# Patient Record
Sex: Female | Born: 1960 | Race: Black or African American | Hispanic: No | Smoking: Never smoker
Health system: Southern US, Community
[De-identification: ages and names within clinical notes are randomized; demographics above are authoritative.]

## PROBLEM LIST (undated history)

## (undated) DIAGNOSIS — R112 Nausea with vomiting, unspecified: Secondary | ICD-10-CM

## (undated) DIAGNOSIS — N390 Urinary tract infection, site not specified: Secondary | ICD-10-CM

## (undated) DIAGNOSIS — Z9889 Other specified postprocedural states: Secondary | ICD-10-CM

## (undated) DIAGNOSIS — Z9289 Personal history of other medical treatment: Secondary | ICD-10-CM

## (undated) DIAGNOSIS — I514 Myocarditis, unspecified: Secondary | ICD-10-CM

## (undated) HISTORY — PX: CHOLECYSTECTOMY: SHX55

## (undated) HISTORY — DX: Personal history of other medical treatment: Z92.89

## (undated) HISTORY — DX: Myocarditis, unspecified: I51.4

---

## 1997-07-21 ENCOUNTER — Other Ambulatory Visit: Admission: RE | Admit: 1997-07-21 | Discharge: 1997-07-21 | Payer: Self-pay | Admitting: Obstetrics & Gynecology

## 1998-05-11 ENCOUNTER — Other Ambulatory Visit: Admission: RE | Admit: 1998-05-11 | Discharge: 1998-05-11 | Payer: Self-pay | Admitting: Obstetrics & Gynecology

## 1999-05-20 ENCOUNTER — Other Ambulatory Visit: Admission: RE | Admit: 1999-05-20 | Discharge: 1999-05-20 | Payer: Self-pay | Admitting: Obstetrics and Gynecology

## 2000-08-15 ENCOUNTER — Other Ambulatory Visit: Admission: RE | Admit: 2000-08-15 | Discharge: 2000-08-15 | Payer: Self-pay | Admitting: Obstetrics and Gynecology

## 2001-09-25 ENCOUNTER — Other Ambulatory Visit: Admission: RE | Admit: 2001-09-25 | Discharge: 2001-09-25 | Payer: Self-pay | Admitting: Obstetrics and Gynecology

## 2005-04-26 ENCOUNTER — Emergency Department (HOSPITAL_COMMUNITY): Admission: EM | Admit: 2005-04-26 | Discharge: 2005-04-26 | Payer: Self-pay | Admitting: Emergency Medicine

## 2005-10-12 ENCOUNTER — Encounter: Admission: RE | Admit: 2005-10-12 | Discharge: 2005-10-12 | Payer: Self-pay | Admitting: Family Medicine

## 2005-10-20 ENCOUNTER — Ambulatory Visit (HOSPITAL_BASED_OUTPATIENT_CLINIC_OR_DEPARTMENT_OTHER): Admission: RE | Admit: 2005-10-20 | Discharge: 2005-10-20 | Payer: Self-pay | Admitting: *Deleted

## 2012-04-22 ENCOUNTER — Encounter (HOSPITAL_COMMUNITY): Payer: Self-pay

## 2012-04-23 ENCOUNTER — Other Ambulatory Visit (HOSPITAL_COMMUNITY): Payer: Self-pay | Admitting: Orthopaedic Surgery

## 2012-04-23 ENCOUNTER — Other Ambulatory Visit (HOSPITAL_COMMUNITY): Payer: Self-pay | Admitting: Orthopedic Surgery

## 2012-04-24 ENCOUNTER — Encounter (HOSPITAL_COMMUNITY)
Admission: RE | Admit: 2012-04-24 | Discharge: 2012-04-24 | Disposition: A | Discharge status: Home / Self Care | Payer: 59 | Source: Ambulatory Visit | Attending: Orthopaedic Surgery | Admitting: Orthopaedic Surgery

## 2012-04-24 ENCOUNTER — Encounter (HOSPITAL_COMMUNITY): Payer: Self-pay

## 2012-04-24 HISTORY — DX: Urinary tract infection, site not specified: N39.0

## 2012-04-24 HISTORY — DX: Nausea with vomiting, unspecified: R11.2

## 2012-04-24 HISTORY — DX: Other specified postprocedural states: Z98.890

## 2012-04-24 LAB — COMPREHENSIVE METABOLIC PANEL
ALT: 11 U/L (ref 0–35)
AST: 17 U/L (ref 0–37)
Albumin: 3.5 g/dL (ref 3.5–5.2)
Alkaline Phosphatase: 68 U/L (ref 39–117)
BUN: 9 mg/dL (ref 6–23)
CO2: 25 mEq/L (ref 19–32)
Creatinine, Ser: 0.96 mg/dL (ref 0.50–1.10)
GFR calc Af Amer: 79 mL/min — ABNORMAL LOW (ref >90)
Glucose, Bld: 95 mg/dL (ref 70–99)
Potassium: 4.1 mEq/L (ref 3.5–5.1)

## 2012-04-24 LAB — CBC
HCT: 41.6 % (ref 36.0–46.0)
Hemoglobin: 14.1 g/dL (ref 12.0–15.0)
MCH: 30.2 pg (ref 26.0–34.0)
MCHC: 33.9 g/dL (ref 30.0–36.0)

## 2012-04-24 NOTE — Pre-Procedure Instructions (Addendum)
SAHEJ HAUSWIRTH  04/24/2012   Your procedure is scheduled on: 04/29/12  Report to [facility] at 1030 AM.  Call this number if you have problems the morning of surgery: [phone]   Remember:   Do not eat food or drink liquids after midnight.   Take these medicines the morning of surgery with A SIP OF WATER: flexeril, tramadol   Do not wear jewelry, make-up or nail polish.  Do not wear lotions, powders, or perfumes. You may wear deodorant.  Do not shave 48 hours prior to surgery. Men may shave face and neck.  Do not bring valuables to the hospital.  Contacts, dentures or bridgework may not be worn into surgery.  Leave suitcase in the car. After surgery it may be brought to your room.  For patients admitted to the hospital, checkout time is 11:00 AM the day of  discharge.   Patients discharged the day of surgery will not be allowed to drive  home.  Name and phone number of your driver:   Special Instructions: Incentive Spirometry - Practice and bring it with you on the day of surgery. Shower using CHG 2 nights before surgery and the night before surgery.  If you shower the day of surgery use CHG.  Use special wash - you have one bottle of CHG for all showers.  You should use approximately 1/3 of the bottle for each shower.   Please read over the following fact sheets that you were given: Pain Booklet, Coughing and Deep Breathing, MRSA Information and Surgical Site Infection Prevention

## 2012-04-26 DIAGNOSIS — M4722 Other spondylosis with radiculopathy, cervical region: Secondary | ICD-10-CM | POA: Diagnosis present

## 2012-04-26 NOTE — H&P (Signed)
Caroline Garcia is an 52 y.o. female.   Chief Complaint: neck pain and bilateral arm pain Left>right HPI: over 2 years of worsening upper extremity pain with activity.  Left arm with pain radiating from the neck into her shoulder and to the middle fingers.  She has had temporary relief with conservative measures including TENS unit , traction and NSAIDs as well as steroid dose pack.   MRI with C5-6 and C6-7 stenosis related to broad based disc osteophyte complex with extension to the left and mild cord flattening at C5-6 and broad based disc protrusion left at C6-7. She wishes to proceed with surgical intervention.  Past Medical History  Diagnosis Date  . PONV (postoperative nausea and vomiting)   . UTI (urinary tract infection)     hx    Past Surgical History  Procedure Laterality Date  . Cholecystectomy      History reviewed. No pertinent family history. Social History:  reports that she has never smoked. She does not have any smokeless tobacco history on file. She reports that she does not drink alcohol or use illicit drugs.  Allergies: No Known Allergies  Medications Prior to Admission  Medication Sig Dispense Refill  . cyclobenzaprine (FLEXERIL) 10 MG tablet Take 10 mg by mouth at bedtime as needed for muscle spasms.      . traMADol (ULTRAM) 50 MG tablet Take 50 mg by mouth every 6 (six) hours as needed for pain.        No results found for this or any previous visit (from the past 48 hour(s)). No results found.  Review of Systems  Constitutional: Negative.   HENT: Positive for neck pain.   Eyes: Negative.   Respiratory: Negative.   Cardiovascular: Negative.   Gastrointestinal: Negative.   Genitourinary: Negative.   Skin: Negative.   Neurological: Negative.   Endo/Heme/Allergies: Negative.   Psychiatric/Behavioral: Negative.     Blood pressure 138/93, pulse 94, temperature 98.3 F (36.8 C), temperature source Oral, resp. rate 20, last menstrual period 04/12/2012,  SpO2 98.00%. Physical Exam  Constitutional: She is oriented to person, place, and time. She appears well-developed and well-nourished.  HENT:  Head: Normocephalic and atraumatic.  Eyes: EOM are normal. Pupils are equal, round, and reactive to light.  Cardiovascular: Normal rate.   Respiratory: Effort normal and breath sounds normal.  GI: Soft. Bowel sounds are normal.  Musculoskeletal:  +sprurling on the left.  Pain with ROM in cervical spine.  Strength intact in UEs.  DTRs +2 symmetric.  Neurological: She is alert and oriented to person, place, and time.  Skin: Skin is warm and dry.  Psychiatric: She has a normal mood and affect.     Assessment/Plan Cervical spondylosis C5-6 and C6-7  PLAN:  Anterior cervical discectomy and fusion C5-6 and C6-7 with allograft, plate and screws.  Aniello Christopoulos C 04/29/2012, 12:30 PM

## 2012-04-28 MED ORDER — CEFAZOLIN SODIUM-DEXTROSE 2-3 GM-% IV SOLR
2.0000 g | INTRAVENOUS | Status: AC
  Administered 2012-04-29: 2 g via INTRAVENOUS
  Filled 2012-04-28: qty 50

## 2012-04-29 ENCOUNTER — Ambulatory Visit (HOSPITAL_COMMUNITY): Payer: 59

## 2012-04-29 ENCOUNTER — Encounter (HOSPITAL_COMMUNITY): Payer: Self-pay | Admitting: Certified Registered Nurse Anesthetist

## 2012-04-29 ENCOUNTER — Encounter (HOSPITAL_COMMUNITY): Payer: Self-pay

## 2012-04-29 ENCOUNTER — Inpatient Hospital Stay (HOSPITAL_COMMUNITY)
Admission: RE | Admit: 2012-04-29 | Discharge: 2012-04-30 | DRG: 473 | Disposition: A | Discharge status: Home / Self Care | Payer: 59 | Source: Ambulatory Visit | Attending: Orthopaedic Surgery | Admitting: Orthopaedic Surgery

## 2012-04-29 ENCOUNTER — Encounter (HOSPITAL_COMMUNITY)
Admission: RE | Disposition: A | Discharge status: Home / Self Care | Payer: Self-pay | Source: Ambulatory Visit | Attending: Orthopaedic Surgery

## 2012-04-29 ENCOUNTER — Ambulatory Visit (HOSPITAL_COMMUNITY): Payer: 59 | Admitting: Certified Registered Nurse Anesthetist

## 2012-04-29 DIAGNOSIS — Z79899 Other long term (current) drug therapy: Secondary | ICD-10-CM

## 2012-04-29 DIAGNOSIS — Z9089 Acquired absence of other organs: Secondary | ICD-10-CM

## 2012-04-29 DIAGNOSIS — M4722 Other spondylosis with radiculopathy, cervical region: Secondary | ICD-10-CM | POA: Diagnosis present

## 2012-04-29 DIAGNOSIS — K219 Gastro-esophageal reflux disease without esophagitis: Secondary | ICD-10-CM | POA: Diagnosis present

## 2012-04-29 DIAGNOSIS — M47812 Spondylosis without myelopathy or radiculopathy, cervical region: Principal | ICD-10-CM | POA: Diagnosis present

## 2012-04-29 DIAGNOSIS — M5020 Other cervical disc displacement, unspecified cervical region: Secondary | ICD-10-CM | POA: Diagnosis present

## 2012-04-29 HISTORY — PX: ANTERIOR CERVICAL DECOMP/DISCECTOMY FUSION: SHX1161

## 2012-04-29 SURGERY — ANTERIOR CERVICAL DECOMPRESSION/DISCECTOMY FUSION 2 LEVELS
Anesthesia: General | Site: Spine Cervical | Wound class: Clean

## 2012-04-29 MED ORDER — SENNOSIDES-DOCUSATE SODIUM 8.6-50 MG PO TABS: 1.0000 | ORAL_TABLET | Freq: Every evening | ORAL | Status: DC | PRN

## 2012-04-29 MED ORDER — SODIUM CHLORIDE 0.9 % IJ SOLN: 3.0000 mL | Freq: Two times a day (BID) | INTRAMUSCULAR | Status: DC

## 2012-04-29 MED ORDER — KCL IN DEXTROSE-NACL 20-5-0.45 MEQ/L-%-% IV SOLN
INTRAVENOUS | Status: DC
  Administered 2012-04-29: 18:00:00 via INTRAVENOUS
  Filled 2012-04-29 (×3): qty 1000

## 2012-04-29 MED ORDER — SODIUM CHLORIDE 0.9 % IJ SOLN: 3.0000 mL | INTRAMUSCULAR | Status: DC | PRN

## 2012-04-29 MED ORDER — PANTOPRAZOLE SODIUM 40 MG IV SOLR
40.0000 mg | Freq: Every day | INTRAVENOUS | Status: DC
  Administered 2012-04-29: 40 mg via INTRAVENOUS
  Filled 2012-04-29 (×4): qty 40

## 2012-04-29 MED ORDER — LIDOCAINE HCL (CARDIAC) 20 MG/ML IV SOLN
INTRAVENOUS | Status: DC | PRN
  Administered 2012-04-29: 40 mg via INTRAVENOUS

## 2012-04-29 MED ORDER — METHOCARBAMOL 500 MG PO TABS: 500.0000 mg | ORAL_TABLET | Freq: Four times a day (QID) | ORAL | Status: DC | PRN

## 2012-04-29 MED ORDER — ACETAMINOPHEN 650 MG RE SUPP: 650.0000 mg | RECTAL | Status: DC | PRN

## 2012-04-29 MED ORDER — MIDAZOLAM HCL 5 MG/5ML IJ SOLN
INTRAMUSCULAR | Status: DC | PRN
  Administered 2012-04-29: 2 mg via INTRAVENOUS

## 2012-04-29 MED ORDER — MENTHOL 3 MG MT LOZG
1.0000 | LOZENGE | OROMUCOSAL | Status: DC | PRN
  Filled 2012-04-29: qty 9

## 2012-04-29 MED ORDER — HYDROCODONE-ACETAMINOPHEN 5-325 MG PO TABS: 1.0000 | ORAL_TABLET | ORAL | Status: DC | PRN

## 2012-04-29 MED ORDER — 0.9 % SODIUM CHLORIDE (POUR BTL) OPTIME
TOPICAL | Status: DC | PRN
  Administered 2012-04-29: 1000 mL

## 2012-04-29 MED ORDER — KETOROLAC TROMETHAMINE 30 MG/ML IJ SOLN
30.0000 mg | Freq: Once | INTRAMUSCULAR | Status: AC
  Administered 2012-04-29: 30 mg via INTRAVENOUS
  Filled 2012-04-29: qty 1

## 2012-04-29 MED ORDER — CEFAZOLIN SODIUM 1-5 GM-% IV SOLN
1.0000 g | Freq: Three times a day (TID) | INTRAVENOUS | Status: AC
  Administered 2012-04-29 – 2012-04-30 (×2): 1 g via INTRAVENOUS
  Filled 2012-04-29 (×2): qty 50

## 2012-04-29 MED ORDER — TRAMADOL HCL 50 MG PO TABS: 50.0000 mg | ORAL_TABLET | Freq: Four times a day (QID) | ORAL | Status: DC | PRN

## 2012-04-29 MED ORDER — OXYCODONE HCL 5 MG/5ML PO SOLN: 5.0000 mg | Freq: Once | ORAL | Status: DC | PRN

## 2012-04-29 MED ORDER — BUPIVACAINE-EPINEPHRINE 0.25% -1:200000 IJ SOLN
INTRAMUSCULAR | Status: DC | PRN
  Administered 2012-04-29: 7 mL

## 2012-04-29 MED ORDER — LACTATED RINGERS IV SOLN
INTRAVENOUS | Status: DC | PRN
  Administered 2012-04-29 (×2): via INTRAVENOUS

## 2012-04-29 MED ORDER — METHOCARBAMOL 100 MG/ML IJ SOLN
500.0000 mg | Freq: Four times a day (QID) | INTRAVENOUS | Status: DC | PRN
  Filled 2012-04-29: qty 5

## 2012-04-29 MED ORDER — PHENOL 1.4 % MT LIQD: 1.0000 | OROMUCOSAL | Status: DC | PRN

## 2012-04-29 MED ORDER — ONDANSETRON HCL 4 MG/2ML IJ SOLN
INTRAMUSCULAR | Status: DC | PRN
  Administered 2012-04-29: 4 mg via INTRAVENOUS

## 2012-04-29 MED ORDER — OXYCODONE-ACETAMINOPHEN 5-325 MG PO TABS: 1.0000 | ORAL_TABLET | ORAL | Status: DC | PRN

## 2012-04-29 MED ORDER — OXYCODONE HCL 5 MG PO TABS: 5.0000 mg | ORAL_TABLET | Freq: Once | ORAL | Status: DC | PRN

## 2012-04-29 MED ORDER — GLYCOPYRROLATE 0.2 MG/ML IJ SOLN
INTRAMUSCULAR | Status: DC | PRN
  Administered 2012-04-29: .6 mg via INTRAVENOUS

## 2012-04-29 MED ORDER — ONDANSETRON HCL 4 MG/2ML IJ SOLN
4.0000 mg | INTRAMUSCULAR | Status: DC | PRN
  Administered 2012-04-29 (×2): 4 mg via INTRAVENOUS
  Filled 2012-04-29 (×2): qty 2

## 2012-04-29 MED ORDER — SODIUM CHLORIDE 0.9 % IV SOLN
250.0000 mL | INTRAVENOUS | Status: DC
  Administered 2012-04-29: 250 mL via INTRAVENOUS

## 2012-04-29 MED ORDER — LACTATED RINGERS IV SOLN
INTRAVENOUS | Status: DC
  Administered 2012-04-29: 20 mL/h via INTRAVENOUS

## 2012-04-29 MED ORDER — BISACODYL 10 MG RE SUPP: 10.0000 mg | Freq: Every day | RECTAL | Status: DC | PRN

## 2012-04-29 MED ORDER — MORPHINE SULFATE 2 MG/ML IJ SOLN
1.0000 mg | INTRAMUSCULAR | Status: DC | PRN
  Administered 2012-04-29: 2 mg via INTRAVENOUS
  Filled 2012-04-29: qty 1

## 2012-04-29 MED ORDER — ALUM & MAG HYDROXIDE-SIMETH 200-200-20 MG/5ML PO SUSP: 30.0000 mL | Freq: Four times a day (QID) | ORAL | Status: DC | PRN

## 2012-04-29 MED ORDER — FENTANYL CITRATE 0.05 MG/ML IJ SOLN
INTRAMUSCULAR | Status: DC | PRN
  Administered 2012-04-29: 150 ug via INTRAVENOUS

## 2012-04-29 MED ORDER — ACETAMINOPHEN 325 MG PO TABS: 650.0000 mg | ORAL_TABLET | ORAL | Status: DC | PRN

## 2012-04-29 MED ORDER — OXYCODONE-ACETAMINOPHEN 5-325 MG PO TABS
1.0000 | ORAL_TABLET | ORAL | Status: DC | PRN
  Administered 2012-04-29 – 2012-04-30 (×3): 2 via ORAL
  Filled 2012-04-29 (×3): qty 2

## 2012-04-29 MED ORDER — ROCURONIUM BROMIDE 100 MG/10ML IV SOLN
INTRAVENOUS | Status: DC | PRN
  Administered 2012-04-29: 50 mg via INTRAVENOUS
  Administered 2012-04-29 (×2): 10 mg via INTRAVENOUS

## 2012-04-29 MED ORDER — NEOSTIGMINE METHYLSULFATE 1 MG/ML IJ SOLN
INTRAMUSCULAR | Status: DC | PRN
  Administered 2012-04-29: 4 mg via INTRAVENOUS

## 2012-04-29 MED ORDER — ZOLPIDEM TARTRATE 5 MG PO TABS: 5.0000 mg | ORAL_TABLET | Freq: Every evening | ORAL | Status: DC | PRN

## 2012-04-29 MED ORDER — HYDROMORPHONE HCL PF 1 MG/ML IJ SOLN
INTRAMUSCULAR | Status: AC
  Administered 2012-04-29: 0.5 mg via INTRAVENOUS
  Filled 2012-04-29: qty 1

## 2012-04-29 MED ORDER — PHENYLEPHRINE HCL 10 MG/ML IJ SOLN
INTRAMUSCULAR | Status: DC | PRN
  Administered 2012-04-29 (×2): 80 ug via INTRAVENOUS

## 2012-04-29 MED ORDER — HYDROMORPHONE HCL PF 1 MG/ML IJ SOLN
0.2500 mg | INTRAMUSCULAR | Status: DC | PRN
  Administered 2012-04-29: 0.5 mg via INTRAVENOUS

## 2012-04-29 MED ORDER — PROPOFOL 10 MG/ML IV BOLUS
INTRAVENOUS | Status: DC | PRN
  Administered 2012-04-29: 140 mg via INTRAVENOUS

## 2012-04-29 MED ORDER — DOCUSATE SODIUM 100 MG PO CAPS
100.0000 mg | ORAL_CAPSULE | Freq: Two times a day (BID) | ORAL | Status: DC
  Administered 2012-04-29 – 2012-04-30 (×2): 100 mg via ORAL
  Filled 2012-04-29 (×5): qty 1

## 2012-04-29 MED ORDER — FLEET ENEMA 7-19 GM/118ML RE ENEM: 1.0000 | ENEMA | Freq: Once | RECTAL | Status: AC | PRN

## 2012-04-29 SURGICAL SUPPLY — 57 items
APL SKNCLS STERI-STRIP NONHPOA (GAUZE/BANDAGES/DRESSINGS) ×1
BENZOIN TINCTURE PRP APPL 2/3 (GAUZE/BANDAGES/DRESSINGS) ×3 IMPLANT
BIT DRILL SKYLINE 12MM (BIT) ×1 IMPLANT
BLADE SURG ROTATE 9660 (MISCELLANEOUS) IMPLANT
BONE CERV LORDOTIC 14.5X12X6 (Bone Implant) ×2 IMPLANT
BONE CERV LORDOTIC 14.5X12X7 (Bone Implant) ×2 IMPLANT
BUR ROUND FLUTED 4 SOFT TCH (BURR) ×1 IMPLANT
CLOTH BEACON ORANGE TIMEOUT ST (SAFETY) ×2 IMPLANT
CLSR STERI-STRIP ANTIMIC 1/2X4 (GAUZE/BANDAGES/DRESSINGS) ×1 IMPLANT
COLLAR CERV LO CONTOUR FIRM DE (SOFTGOODS) ×2 IMPLANT
CORDS BIPOLAR (ELECTRODE) ×1 IMPLANT
COVER SURGICAL LIGHT HANDLE (MISCELLANEOUS) ×2 IMPLANT
DRAPE C-ARM 42X72 X-RAY (DRAPES) ×2 IMPLANT
DRAPE MICROSCOPE LEICA (MISCELLANEOUS) ×2 IMPLANT
DRAPE PROXIMA HALF (DRAPES) ×2 IMPLANT
DRILL BIT SKYLINE 12MM (BIT) ×2
DURAPREP 6ML APPLICATOR 50/CS (WOUND CARE) ×2 IMPLANT
ELECT COATED BLADE 2.86 ST (ELECTRODE) ×2 IMPLANT
ELECT REM PT RETURN 9FT ADLT (ELECTROSURGICAL) ×2
ELECTRODE REM PT RTRN 9FT ADLT (ELECTROSURGICAL) ×1 IMPLANT
EVACUATOR 1/8 PVC DRAIN (DRAIN) ×2 IMPLANT
GAUZE XEROFORM 1X8 LF (GAUZE/BANDAGES/DRESSINGS) ×2 IMPLANT
GLOVE BIOGEL PI IND STRL 7.5 (GLOVE) ×1 IMPLANT
GLOVE BIOGEL PI IND STRL 8 (GLOVE) ×1 IMPLANT
GLOVE BIOGEL PI INDICATOR 7.5 (GLOVE) ×1
GLOVE BIOGEL PI INDICATOR 8 (GLOVE) ×1
GLOVE ECLIPSE 7.0 STRL STRAW (GLOVE) ×2 IMPLANT
GLOVE ORTHO TXT STRL SZ7.5 (GLOVE) ×2 IMPLANT
GOWN PREVENTION PLUS LG XLONG (DISPOSABLE) ×1 IMPLANT
GOWN PREVENTION PLUS XLARGE (GOWN DISPOSABLE) ×2 IMPLANT
GOWN STRL NON-REIN LRG LVL3 (GOWN DISPOSABLE) ×4 IMPLANT
GRAFT BNE SPCR VG2 14.5X12X6 (Bone Implant) IMPLANT
GRAFT BNE SPCR VG2 14.5X12X7 (Bone Implant) IMPLANT
HEAD HALTER (SOFTGOODS) ×2 IMPLANT
HEMOSTAT SURGICEL 2X14 (HEMOSTASIS) IMPLANT
KIT BASIN OR (CUSTOM PROCEDURE TRAY) ×2 IMPLANT
KIT ROOM TURNOVER OR (KITS) ×2 IMPLANT
MANIFOLD NEPTUNE II (INSTRUMENTS) ×2 IMPLANT
NDL 25GX 5/8IN NON SAFETY (NEEDLE) ×1 IMPLANT
NEEDLE 25GX 5/8IN NON SAFETY (NEEDLE) ×2 IMPLANT
NS IRRIG 1000ML POUR BTL (IV SOLUTION) ×2 IMPLANT
PACK ORTHO CERVICAL (CUSTOM PROCEDURE TRAY) ×2 IMPLANT
PAD ARMBOARD 7.5X6 YLW CONV (MISCELLANEOUS) ×4 IMPLANT
PATTIES SURGICAL .5 X.5 (GAUZE/BANDAGES/DRESSINGS) ×1 IMPLANT
PIN TEMP SKYLINE THREADED (PIN) ×2 IMPLANT
PLATE TWO LEVEL SKYLINE 30MM (Plate) ×1 IMPLANT
SCREW VARIABLE SELF TAP 12MM (Screw) ×12 IMPLANT
SPONGE GAUZE 4X4 12PLY (GAUZE/BANDAGES/DRESSINGS) ×2 IMPLANT
STRIP CLOSURE SKIN 1/2X4 (GAUZE/BANDAGES/DRESSINGS) ×2 IMPLANT
SURGIFLO TRUKIT (HEMOSTASIS) IMPLANT
SUT VIC AB 3-0 X1 27 (SUTURE) ×2 IMPLANT
SUT VICRYL 4-0 PS2 18IN ABS (SUTURE) ×4 IMPLANT
SYR 30ML SLIP (SYRINGE) ×2 IMPLANT
TAPE CLOTH SURG 4X10 WHT LF (GAUZE/BANDAGES/DRESSINGS) ×1 IMPLANT
TOWEL OR 17X24 6PK STRL BLUE (TOWEL DISPOSABLE) ×2 IMPLANT
TOWEL OR 17X26 10 PK STRL BLUE (TOWEL DISPOSABLE) ×2 IMPLANT
WATER STERILE IRR 1000ML POUR (IV SOLUTION) ×2 IMPLANT

## 2012-04-29 NOTE — Transfer of Care (Signed)
Immediate Anesthesia Transfer of Care Note  Patient: Caroline Garcia  Procedure(s) Performed: Procedure(s) with comments: C5-6, C6-7 Anterior Cervical Discectomy and Fusion, Allograft, Plate (N/A) - Z6-1, C6-7 Anterior Cervical Discectomy and Fusion, Allograft and Plate  Patient Location: PACU  Anesthesia Type:General  Level of Consciousness: awake, alert , oriented and patient cooperative  Airway & Oxygen Therapy: Patient Spontanous Breathing and Patient connected to nasal cannula oxygen  Post-op Assessment: Report given to PACU RN, Post -op Vital signs reviewed and stable and Patient moving all extremities X 4  Post vital signs: Reviewed and stable  Complications: No apparent anesthesia complications

## 2012-04-29 NOTE — Brief Op Note (Cosign Needed)
04/29/2012  2:35 PM  PATIENT:  Caroline Garcia  52 y.o. 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  PROCEDURE:  Procedure(s) with comments: C5-6, C6-7 Anterior Cervical Discectomy and Fusion, Allograft, Plate (N/A) - Z6-1, C6-7 Anterior Cervical Discectomy and Fusion, Allograft and Plate  SURGEON:  Surgeon(s) and Role:    * Eldred Manges, MD - Primary  PHYSICIAN ASSISTANT: Maud Deed PAC  ASSISTANTS: none   ANESTHESIA:   general  EBL:  Total I/O In: 1000 [I.V.:1000] Out: -   BLOOD ADMINISTERED:none  DRAINS: (1) Hemovact drain(s) in the anterior neck with  Suction Open   LOCAL MEDICATIONS USED:  MARCAINE     SPECIMEN:  No Specimen  DISPOSITION OF SPECIMEN:  N/A  COUNTS:  YES  TOURNIQUET:  * No tourniquets in log *  DICTATION: .Note written in EPIC  PLAN OF CARE: Admit to inpatient   PATIENT DISPOSITION:  PACU - hemodynamically stable.   Delay start of Pharmacological VTE agent (>24hrs) due to surgical blood loss or risk of bleeding: yes

## 2012-04-29 NOTE — Anesthesia Procedure Notes (Addendum)
Procedure Name: Intubation Date/Time: 04/29/2012 12:46 PM Performed by: Rogelia Boga Pre-anesthesia Checklist: Patient identified, Emergency Drugs available, Suction available, Patient being monitored and Timeout performed Patient Re-evaluated:Patient Re-evaluated prior to inductionOxygen Delivery Method: Circle system utilized Preoxygenation: Pre-oxygenation with 100% oxygen Intubation Type: IV induction Ventilation: Two handed mask ventilation required and Oral airway inserted - appropriate to patient size Laryngoscope Size: Mac and 3 Grade View: Grade II Tube type: Oral Tube size: 7.5 mm Number of attempts: 1 Airway Equipment and Method: Stylet and Oral airway Placement Confirmation: ETT inserted through vocal cords under direct vision,  positive ETCO2 and breath sounds checked- equal and bilateral Secured at: 23 cm Tube secured with: Tape Dental Injury: Injury to lip

## 2012-04-29 NOTE — Anesthesia Preprocedure Evaluation (Addendum)
Anesthesia Evaluation  Patient identified by MRN, date of birth, ID band Patient awake    Reviewed: Allergy & Precautions, H&P , NPO status , Patient's Chart, lab work & pertinent test results  History of Anesthesia Complications (+) PONV  Airway Mallampati: III TM Distance: >3 FB Neck ROM: Full    Dental no notable dental hx. (+) Teeth Intact and Dental Advisory Given   Pulmonary neg pulmonary ROS,  breath sounds clear to auscultation  Pulmonary exam normal       Cardiovascular negative cardio ROS  Rhythm:Regular Rate:Normal     Neuro/Psych negative neurological ROS  negative psych ROS   GI/Hepatic Neg liver ROS, GERD-  Controlled,  Endo/Other  negative endocrine ROS  Renal/GU negative Renal ROS  negative genitourinary   Musculoskeletal   Abdominal   Peds  Hematology negative hematology ROS (+)   Anesthesia Other Findings   Reproductive/Obstetrics negative OB ROS                          Anesthesia Physical Anesthesia Plan  ASA: II  Anesthesia Plan: General   Post-op Pain Management:    Induction: Intravenous  Airway Management Planned: Oral ETT  Additional Equipment:   Intra-op Plan:   Post-operative Plan: Extubation in OR  Informed Consent: I have reviewed the patients History and Physical, chart, labs and discussed the procedure including the risks, benefits and alternatives for the proposed anesthesia with the patient or authorized representative who has indicated his/her understanding and acceptance.   Dental advisory given  Plan Discussed with: CRNA  Anesthesia Plan Comments:         Anesthesia Quick Evaluation

## 2012-04-29 NOTE — Op Note (Signed)
NAMEGREDMARIE, DELANGE             ACCOUNT NO.:  [id number]  MEDICAL RECORD NO.:  [id number]  LOCATION:  5N17C                        FACILITY:  [facility]  PHYSICIAN:  Anaisabel Pederson C. Ophelia Charter, M.D.    DATE OF BIRTH:  1960-10-04  DATE OF PROCEDURE:  04/29/2012 DATE OF DISCHARGE:                              OPERATIVE REPORT   PREOPERATIVE DIAGNOSIS:  Cervical spondylosis and HNP.  POSTOPERATIVE DIAGNOSIS:  Cervical spondylosis and HNP.  PROCEDURE:  C5-6, C6-7 anterior cervical diskectomy and fusion, allograft and plate.  SURGEON:  Tsutomu Barfoot C. Ophelia Charter, MD  ASSISTANT:  Wende Neighbors, P.A., medically necessary and present for the entire procedure.  ESTIMATED BLOOD LOSS:  Minimal.  DRAINS:  None.  ANESTHESIA:  GOT plus 7 mL of Marcaine local.  COMPONENTS IMPLANTED:  DePuy Skyline 30-mm plate with 12 mm screws x6, locking screws.  DESCRIPTION OF PROCEDURE:  After induction of general anesthesia, orotracheal intubation, and altered tractional horseshoe head holder, arms tucked to the side.  Pads over the ulnar nerve.  Wrist restraints tied for traction be applied to the body habitus.  Neck was prepped with DuraPrep.  After the neck was adjusted so that it was in the midline without angulation, right or left.  Tincture of benzoin, and some tape was applied to the anterior chest wall distally so that the breast allowed for visualization of the area of the skin on the neck.  After prepping with DuraPrep, the area was squared with towels and Betadine.  was applied.  Sterile skin marker was used on prominent skin crease midway between the 2 plane levels based on palpable landmarks including carotid tubercle in cricothyroid cartilage.  Time-out procedure was completed.  Sterile Mayo stand was at the head and thyroid sheet and drapes were applied.  Incision was made at the midline, extended in the left, platysma was divided in line with the fibers.  Blunt dissection down the level longus coli above  the omohyoid muscle and short 25 needle was placed at C5-6 with the interspace and the C-arm had to be oblique.  Traction applied to the wrist restraints in order to confirm that this was C5-6 level.  There was a small anterior spur, largest spur at C6-7 corresponding with plain x-ray. Self-retaining Cloward retractors were placed.  Teeth blades right and left smooth blades up and down, diskectomy was performed with 15 scalpel blade, pituitary Cloward curettes.  Operative microscope was draped and brought in.  Posterior aspect of the disk was taken down, bur was used as well as curetting rasp.  Posterior longitudinal ligament was exposed. Posterior longitudinal ligament was taken down and on the left side, there was fragments of disk that were teased out.  There were no pocket, on the left side lateral to the ligament.  Dura was visualized. Uncovertebral joints were stripped with Cloward curettes.  Foramina was enlarged with a 1 and 2 mm Kerrisons rasping and then trial sizing.  A 7 mm graft was inserted with good fit, countersunk 1 mm anterior aspect of the graft was marked with a skin marker in the midline.  Graft was countersunk 1-2 mm.  Identical procedure was repeated at the C6-7 level. There was disk fragments  that were teased out with a black nerve hook grasper micropituitary and removed on the left side, again consistent with the patient's symptoms.  Once the ligament was taken down, spurs were removed,  Endplates were rasped.  Trial sizers and 7 mm graft was inserted with good fit with CRNA pulling traction to open up the disk space to place the graft.  After irrigation with saline solution.  A 30- mm plate was selected, held with a small prong, checked AP and lateral under fluoroscopy and then 12 mm screws were placed x6 to secure the plating grafts.  Once it was confirmed good position on the C-arm, once again looking good in the midline and appropriate length with  screw position good above the grafts, midposition in the C5 vertebrae.  All screws were locked down with a tiny screwdriver at the adjacent screw. Irrigation with saline solution.  Hemovac drain placed in and out technique on the left in line with the skin incision.  Platysma closed with 3-0 and 4-0 Vicryl subcuticular closure.  Tincture of benzoin, Steri-Strips, Marcaine infiltration, postop dressing, and transferred to the recovery room.  Instrument count and needle count was correct.  A soft cervical collar was applied.     Madilynn Montante C. Ophelia Charter, M.D.     MCY/MEDQ  D:  04/29/2012  T:  04/29/2012  Job:  [id number]

## 2012-04-29 NOTE — Preoperative (Signed)
Beta Blockers   Reason not to administer Beta Blockers:Not Applicable 

## 2012-04-29 NOTE — Interval H&P Note (Signed)
History and Physical Interval Note:  04/29/2012 12:31 PM  Caroline Garcia  has presented today for surgery, with the diagnosis of C5-6, C6-7 Spondylosis  The various methods of treatment have been discussed with the patient and family. After consideration of risks, benefits and other options for treatment, the patient has consented to  Procedure(s) with comments: C5-6, C6-7 Anterior Cervical Discectomy and Fusion, Allograft, Plate (N/A) - F6-2, C6-7 Anterior Cervical Discectomy and Fusion, Allograft and Plate as a surgical intervention .  The patient's history has been reviewed, patient examined, no change in status, stable for surgery.  I have reviewed the patient's chart and labs.  Questions were answered to the patient's satisfaction.     YATES,MARK C

## 2012-04-30 ENCOUNTER — Encounter (HOSPITAL_COMMUNITY): Payer: Self-pay | Admitting: Orthopaedic Surgery

## 2012-04-30 MED ORDER — PANTOPRAZOLE SODIUM 40 MG PO TBEC: 40.0000 mg | DELAYED_RELEASE_TABLET | Freq: Every day | ORAL | Status: DC

## 2012-04-30 NOTE — Progress Notes (Signed)
UR COMPLETED  

## 2012-04-30 NOTE — Anesthesia Postprocedure Evaluation (Signed)
  Anesthesia Post-op Note  Patient: Caroline Garcia  Procedure(s) Performed: Procedure(s) with comments: C5-6, C6-7 Anterior Cervical Discectomy and Fusion, Allograft, Plate (N/A) - O1-3, C6-7 Anterior Cervical Discectomy and Fusion, Allograft and Plate  Patient Location: Nursing Unit, 5N17C  Anesthesia Type:General  Level of Consciousness: awake, alert  and oriented  Airway and Oxygen Therapy: Patient Spontanous Breathing  Post-op Pain: moderate  Post-op Assessment: Post-op Vital signs reviewed, Patient's Cardiovascular Status Stable, Respiratory Function Stable and No signs of Nausea or vomiting  Post-op Vital Signs: Reviewed and stable  Complications: No apparent anesthesia complications

## 2012-04-30 NOTE — Discharge Summary (Signed)
Physician Discharge Summary  Patient ID: Caroline Garcia MRN: [id number] DOB/AGE: 51/27/1962 51 y.o.  Admit date: 04/29/2012 Discharge date: 04/30/2012  Admission Diagnoses:cervical spondylosis with HNP  Discharge Diagnoses: same Principal Problem:   Cervical spondylosis with radiculopathy   Discharged Condition: good  Hospital Course: underwent C5-6 , C6-7 ACDF allograft and plate. Satisfactory post op course. Relief of left arm pain   Consults: None  Significant Diagnostic Studies: none  Treatments: surgery: above  Discharge Exam: Blood pressure 128/76, pulse 88, temperature 98.5 F (36.9 C), temperature source Oral, resp. rate 16, last menstrual period 04/12/2012, SpO2 97.00%. Incision/Wound:clean and dry. Drain removed. Normal left arm strength.   Disposition: Final discharge disposition not confirmed     Medication List    STOP taking these medications       cyclobenzaprine 10 MG tablet  Commonly known as:  FLEXERIL      TAKE these medications       methocarbamol 500 MG tablet  Commonly known as:  ROBAXIN  Take 1 tablet (500 mg total) by mouth every 6 (six) hours as needed (spasm).     oxyCODONE-acetaminophen 5-325 MG per tablet  Commonly known as:  ROXICET  Take 1-2 tablets by mouth every 4 (four) hours as needed for pain.     traMADol 50 MG tablet  Commonly known as:  ULTRAM  Take 50 mg by mouth every 6 (six) hours as needed for pain.           Follow-up Information   Follow up with Eldred Manges, MD. Schedule an appointment as soon as possible for a visit in 2 weeks.   Contact information:   [address] [place] [address] [phone]       Signed: Eldred Manges 04/30/2012, 9:30 AM

## 2012-04-30 NOTE — Progress Notes (Signed)
Completed discharge paperwork with patients. Prescriptions given. All questions answered. Dressing changed, and some dressing supplies given to patient. Educated about pain management, exercise, and no bending, twisting, or reaching.  Patient discharged with volunteers to husbands car.

## 2017-05-05 ENCOUNTER — Other Ambulatory Visit: Payer: Self-pay

## 2017-05-05 ENCOUNTER — Encounter: Payer: Self-pay | Admitting: Emergency Medicine

## 2017-05-05 ENCOUNTER — Emergency Department
Admission: EM | Admit: 2017-05-05 | Discharge: 2017-05-05 | Disposition: A | Discharge status: Home / Self Care | Payer: BLUE CROSS/BLUE SHIELD | Attending: Emergency Medicine | Admitting: Emergency Medicine

## 2017-05-05 DIAGNOSIS — R509 Fever, unspecified: Secondary | ICD-10-CM | POA: Insufficient documentation

## 2017-05-05 DIAGNOSIS — R112 Nausea with vomiting, unspecified: Secondary | ICD-10-CM | POA: Diagnosis not present

## 2017-05-05 DIAGNOSIS — R197 Diarrhea, unspecified: Secondary | ICD-10-CM | POA: Insufficient documentation

## 2017-05-05 LAB — URINALYSIS, COMPLETE (UACMP) WITH MICROSCOPIC
BILIRUBIN URINE: NEGATIVE
Glucose, UA: NEGATIVE mg/dL
Hgb urine dipstick: NEGATIVE
KETONES UR: NEGATIVE mg/dL
Nitrite: NEGATIVE
PH: 6 (ref 5.0–8.0)
PROTEIN: NEGATIVE mg/dL
Specific Gravity, Urine: 1.016 (ref 1.005–1.030)

## 2017-05-05 LAB — LIPASE, BLOOD: LIPASE: 21 U/L (ref 11–51)

## 2017-05-05 LAB — COMPREHENSIVE METABOLIC PANEL
ALT: 17 U/L (ref 14–54)
AST: 24 U/L (ref 15–41)
Albumin: 3.7 g/dL (ref 3.5–5.0)
Alkaline Phosphatase: 51 U/L (ref 38–126)
Anion gap: 8 (ref 5–15)
BUN: 8 mg/dL (ref 6–20)
CHLORIDE: 104 mmol/L (ref 101–111)
CO2: 28 mmol/L (ref 22–32)
Calcium: 8.9 mg/dL (ref 8.9–10.3)
Creatinine, Ser: 1.01 mg/dL — ABNORMAL HIGH (ref 0.44–1.00)
Glucose, Bld: 86 mg/dL (ref 65–99)
POTASSIUM: 3.8 mmol/L (ref 3.5–5.1)
Sodium: 140 mmol/L (ref 135–145)
Total Bilirubin: 0.5 mg/dL (ref 0.3–1.2)
Total Protein: 7.3 g/dL (ref 6.5–8.1)

## 2017-05-05 LAB — CBC
HEMATOCRIT: 42.3 % (ref 35.0–47.0)
Hemoglobin: 14.1 g/dL (ref 12.0–16.0)
MCH: 30.2 pg (ref 26.0–34.0)
MCHC: 33.4 g/dL (ref 32.0–36.0)
MCV: 90.4 fL (ref 80.0–100.0)
Platelets: 268 10*3/uL (ref 150–440)
RBC: 4.68 MIL/uL (ref 3.80–5.20)
RDW: 13.7 % (ref 11.5–14.5)
WBC: 4.5 10*3/uL (ref 3.6–11.0)

## 2017-05-05 MED ORDER — ONDANSETRON 4 MG PO TBDP
4.0000 mg | ORAL_TABLET | Freq: Once | ORAL | Status: AC | PRN
  Administered 2017-05-05: 4 mg via ORAL
  Filled 2017-05-05: qty 1

## 2017-05-05 MED ORDER — ONDANSETRON HCL 4 MG PO TABS: 4.0000 mg | ORAL_TABLET | Freq: Three times a day (TID) | ORAL | 0 refills | Status: DC | PRN

## 2017-05-05 MED ORDER — SODIUM CHLORIDE 0.9 % IV BOLUS (SEPSIS)
1000.0000 mL | Freq: Once | INTRAVENOUS | Status: AC
  Administered 2017-05-05: 1000 mL via INTRAVENOUS

## 2017-05-05 NOTE — ED Triage Notes (Signed)
Pt presents to ED via POV c/o fever, abd pain, emesis, and diarrhea since Tuesday. Pt reports one episode of black stools after taking pepto-bismol but no further black stools after that.

## 2017-05-05 NOTE — ED Provider Notes (Addendum)
[facility] Emergency Department Provider Note  ____________________________________________   I have reviewed the triage vital signs and the nursing notes. Where available I have reviewed prior notes and, if possible and indicated, outside hospital notes.    HISTORY  Chief Complaint Emesis and Abdominal Pain    HPI Caroline Garcia is a 57 y.o. female who presents today complaining of nausea vomiting and diarrhea, she had fever initially a few days ago, the fever is gone, she has not actually vomited today but she has had persistent watery diarrhea.  No recent travel no recent antibiotics.  Positive sick contacts in the community.  No dysuria no urinary frequency, she has occasional cramping discomfort with this which is only present when she is actually having a bowel movement.  At this time she has no pain.  She denies any focal abdominal pain at any time.  She is having multiple episodes of watery diarrhea, that is also diminishing, she had 3 today.  It was much more over the last couple days.  She states she had Pepto-Bismol and had one episode of dark stool right after but has not had any since.  She would prefer not to have a rectal exam. Has had no melena no bright red blood per rectum.  The patient states she is feeling dehydrated, she has not passed out but she is felt slightly lightheaded after having so many bouts of diarrhea that is why she is here.  She is also tired of being sick.  Past Medical History:  Diagnosis Date  . PONV (postoperative nausea and vomiting)   . UTI (urinary tract infection)    hx    Patient Active Problem List   Diagnosis Date Noted  . Cervical spondylosis with radiculopathy 04/26/2012    Class: Diagnosis of    Past Surgical History:  Procedure Laterality Date  . ANTERIOR CERVICAL DECOMP/DISCECTOMY FUSION N/A 04/29/2012   Procedure: C5-6, C6-7 Anterior Cervical Discectomy and Fusion, Allograft, Plate;  Surgeon: Eldred Manges, MD;  Location: [facility] OR;  Service: Orthopedics;  Laterality: N/A;  C5-6, C6-7 Anterior Cervical Discectomy and Fusion, Allograft and Plate  . CHOLECYSTECTOMY      Prior to Admission medications   Medication Sig Start Date End Date Taking? Authorizing Provider  methocarbamol (ROBAXIN) 500 MG tablet Take 1 tablet (500 mg total) by mouth every 6 (six) hours as needed (spasm). Patient not taking: Reported on 05/05/2017 04/29/12   Maud Deed, PA-C  oxyCODONE-acetaminophen (ROXICET) 5-325 MG per tablet Take 1-2 tablets by mouth every 4 (four) hours as needed for pain. Patient not taking: Reported on 05/05/2017 04/29/12   Maud Deed, PA-C    Allergies Patient has no known allergies.  History reviewed. No pertinent family history.  Social History Social History   Tobacco Use  . Smoking status: Never Smoker  . Smokeless tobacco: Never Used  Substance Use Topics  . Alcohol use: No  . Drug use: No    Review of Systems Constitutional: No fever/chills Eyes: No visual changes. ENT: No sore throat. No stiff neck no neck pain Cardiovascular: Denies chest pain. Respiratory: Denies shortness of breath. Gastrointestinal:   See HPI  genitourinary: Negative for dysuria. Musculoskeletal: Negative lower extremity swelling Skin: Negative for rash. Neurological: Negative for severe headaches, focal weakness or numbness.   ____________________________________________   PHYSICAL EXAM:  VITAL SIGNS: ED Triage Vitals  Enc Vitals Group     BP 05/05/17 1313 114/69     Pulse Rate 05/05/17 1313  60     Resp 05/05/17 1313 18     Temp 05/05/17 1313 98 F (36.7 C)     Temp Source 05/05/17 1313 Oral     SpO2 05/05/17 1313 100 %     Weight 05/05/17 1313 170 lb (77.1 kg)     Height 05/05/17 1313 5\' 7"  (1.702 m)     Head Circumference --      Peak Flow --      Pain Score 05/05/17 1328 6     Pain Loc --      Pain Edu? --      Excl. in GC? --     Constitutional: Alert and oriented. Well  appearing and in no acute distress. Eyes: Conjunctivae are normal Head: Atraumatic HEENT: No congestion/rhinnorhea. Mucous membranes are moist.  Oropharynx non-erythematous Neck:   Nontender with no meningismus, no masses, no stridor Cardiovascular: Normal rate, regular rhythm. Grossly normal heart sounds.  Good peripheral circulation. Respiratory: Normal respiratory effort.  No retractions. Lungs CTAB. Abdominal: Soft and nontender. No distention. No guarding no rebound Back:  There is no focal tenderness or step off.  there is no midline tenderness there are no lesions noted. there is no CVA tenderness Musculoskeletal: No lower extremity tenderness, no upper extremity tenderness. No joint effusions, no DVT signs strong distal pulses no edema Neurologic:  Normal speech and language. No gross focal neurologic deficits are appreciated.  Skin:  Skin is warm, dry and intact. No rash noted. Psychiatric: Mood and affect are normal. Speech and behavior are normal.  ____________________________________________   LABS (all labs ordered are listed, but only abnormal results are displayed)  Labs Reviewed  COMPREHENSIVE METABOLIC PANEL - Abnormal; Notable for the following components:      Result Value   Creatinine, Ser 1.01 (*)    All other components within normal limits  URINALYSIS, COMPLETE (UACMP) WITH MICROSCOPIC - Abnormal; Notable for the following components:   Color, Urine YELLOW (*)    APPearance CLOUDY (*)    Leukocytes, UA SMALL (*)    Bacteria, UA MANY (*)    Squamous Epithelial / LPF 6-30 (*)    All other components within normal limits  URINE CULTURE  LIPASE, BLOOD  CBC  POC URINE PREG, ED    Pertinent labs  results that were available during my care of the patient were reviewed by me and considered in my medical decision making (see chart for details). ____________________________________________  EKG  I personally interpreted any EKGs ordered by me or  triage  ____________________________________________  RADIOLOGY  Pertinent labs & imaging results that were available during my care of the patient were reviewed by me and considered in my medical decision making (see chart for details). If possible, patient and/or family made aware of any abnormal findings.  No results found. ____________________________________________    PROCEDURES  Procedure(s) performed: None  Procedures  Critical Care performed: None  ____________________________________________   INITIAL IMPRESSION / ASSESSMENT AND PLAN / ED COURSE  Pertinent labs & imaging results that were available during my care of the patient were reviewed by me and considered in my medical decision making (see chart for details).  Patient here with nausea vomiting diarrhea abdominal exam is quite benign.  There is bacteria in her urine but is not a clean catch, there is lots of squamous epithelial cells.  She has no symptoms of UTI do not think her UTI is causing her to have copious watery diarrhea.  We will send a urine  culture but is likely contaminant.  Blood work otherwise quite reassuring she does appear to be mildly hydrated clinically and she is feeling somewhat dehydrated and that is why she is here.  We are giving her IV fluid and we will reassess  ----------------------------------------- 5:12 PM on 05/05/2017 -----------------------------------------  Considering the patient's symptoms, medical history, and physical examination today, I have low suspicion for cholecystitis or biliary pathology, pancreatitis, perforation or bowel obstruction, hernia, intra-abdominal abscess, AAA or dissection, volvulus or intussusception, mesenteric ischemia, ischemic gut, pyelonephritis or appendicitis.  There is no evidence of ovarian or reproductive pathology either.  Patient's abdomen is benign at this time, and she feels much better after IV fluids we will ensure that she can tolerate  p.o. and ambulate prior to possible discharge  ----------------------------------------- 6:33 PM on 05/05/2017 ----------------------------------------- Patient feels much better if she can tolerate p.o. we will go she is very comfortable with this plan.  abd benign at discharge    ____________________________________________   FINAL CLINICAL IMPRESSION(S) / ED DIAGNOSES  Final diagnoses:  None      This chart was dictated using voice recognition software.  Despite best efforts to proofread,  errors can occur which can change meaning.      Jeanmarie Plant, MD 05/05/17 1712    Jeanmarie Plant, MD 05/05/17 [id number]

## 2017-05-06 LAB — URINE CULTURE: Culture: NO GROWTH

## 2017-07-25 DIAGNOSIS — S8391XA Sprain of unspecified site of right knee, initial encounter: Secondary | ICD-10-CM | POA: Diagnosis not present

## 2017-10-03 DIAGNOSIS — Z1151 Encounter for screening for human papillomavirus (HPV): Secondary | ICD-10-CM | POA: Diagnosis not present

## 2017-10-03 DIAGNOSIS — Z130 Encounter for screening for diseases of the blood and blood-forming organs and certain disorders involving the immune mechanism: Secondary | ICD-10-CM | POA: Diagnosis not present

## 2017-10-03 DIAGNOSIS — Z1389 Encounter for screening for other disorder: Secondary | ICD-10-CM | POA: Diagnosis not present

## 2017-10-03 DIAGNOSIS — Z01419 Encounter for gynecological examination (general) (routine) without abnormal findings: Secondary | ICD-10-CM | POA: Diagnosis not present

## 2017-10-03 DIAGNOSIS — Z1231 Encounter for screening mammogram for malignant neoplasm of breast: Secondary | ICD-10-CM | POA: Diagnosis not present

## 2017-10-03 DIAGNOSIS — Z124 Encounter for screening for malignant neoplasm of cervix: Secondary | ICD-10-CM | POA: Diagnosis not present

## 2017-10-04 DIAGNOSIS — Z124 Encounter for screening for malignant neoplasm of cervix: Secondary | ICD-10-CM | POA: Diagnosis not present

## 2017-10-11 DIAGNOSIS — N951 Menopausal and female climacteric states: Secondary | ICD-10-CM | POA: Diagnosis not present

## 2018-01-11 DIAGNOSIS — N951 Menopausal and female climacteric states: Secondary | ICD-10-CM | POA: Diagnosis not present

## 2018-04-07 ENCOUNTER — Emergency Department: Payer: BLUE CROSS/BLUE SHIELD

## 2018-04-07 ENCOUNTER — Other Ambulatory Visit: Payer: Self-pay

## 2018-04-07 ENCOUNTER — Encounter: Payer: Self-pay | Admitting: Emergency Medicine

## 2018-04-07 ENCOUNTER — Emergency Department
Admission: EM | Admit: 2018-04-07 | Discharge: 2018-04-07 | Disposition: A | Discharge status: Home / Self Care | Payer: BLUE CROSS/BLUE SHIELD | Attending: Emergency Medicine | Admitting: Emergency Medicine

## 2018-04-07 DIAGNOSIS — R0602 Shortness of breath: Secondary | ICD-10-CM | POA: Diagnosis not present

## 2018-04-07 DIAGNOSIS — R079 Chest pain, unspecified: Secondary | ICD-10-CM | POA: Insufficient documentation

## 2018-04-07 DIAGNOSIS — R52 Pain, unspecified: Secondary | ICD-10-CM

## 2018-04-07 DIAGNOSIS — R072 Precordial pain: Secondary | ICD-10-CM | POA: Diagnosis not present

## 2018-04-07 LAB — CBC
HEMATOCRIT: 42.9 % (ref 36.0–46.0)
Hemoglobin: 13.8 g/dL (ref 12.0–15.0)
MCH: 29.2 pg (ref 26.0–34.0)
MCHC: 32.2 g/dL (ref 30.0–36.0)
MCV: 90.7 fL (ref 80.0–100.0)
NRBC: 0 % (ref 0.0–0.2)
Platelets: 342 10*3/uL (ref 150–400)
RBC: 4.73 MIL/uL (ref 3.87–5.11)
RDW: 13.6 % (ref 11.5–15.5)
WBC: 13.1 10*3/uL — AB (ref 4.0–10.5)

## 2018-04-07 LAB — HEPATIC FUNCTION PANEL
ALK PHOS: 46 U/L (ref 38–126)
ALT: 16 U/L (ref 0–44)
AST: 20 U/L (ref 15–41)
Albumin: 3.7 g/dL (ref 3.5–5.0)
BILIRUBIN TOTAL: 0.5 mg/dL (ref 0.3–1.2)
TOTAL PROTEIN: 7 g/dL (ref 6.5–8.1)

## 2018-04-07 LAB — TROPONIN I
Troponin I: <0.03 ng/mL (ref <0.03)
Troponin I: <0.03 ng/mL (ref <0.03)

## 2018-04-07 LAB — POCT PREGNANCY, URINE: Preg Test, Ur: NEGATIVE

## 2018-04-07 LAB — BASIC METABOLIC PANEL
Anion gap: 6 (ref 5–15)
BUN: 16 mg/dL (ref 6–20)
CHLORIDE: 106 mmol/L (ref 98–111)
CO2: 27 mmol/L (ref 22–32)
Calcium: 9.6 mg/dL (ref 8.9–10.3)
Creatinine, Ser: 1.15 mg/dL — ABNORMAL HIGH (ref 0.44–1.00)
GFR calc non Af Amer: 53 mL/min — ABNORMAL LOW (ref >60)
Glucose, Bld: 92 mg/dL (ref 70–99)
POTASSIUM: 4.6 mmol/L (ref 3.5–5.1)
SODIUM: 139 mmol/L (ref 135–145)

## 2018-04-07 LAB — FIBRIN DERIVATIVES D-DIMER (ARMC ONLY): Fibrin derivatives D-dimer (ARMC): 268.19 ng/mL (FEU) (ref 0.00–499.00)

## 2018-04-07 LAB — LIPASE, BLOOD: Lipase: 25 U/L (ref 11–51)

## 2018-04-07 MED ORDER — ALUM & MAG HYDROXIDE-SIMETH 200-200-20 MG/5ML PO SUSP
30.0000 mL | Freq: Once | ORAL | Status: AC
  Administered 2018-04-07: 30 mL via ORAL
  Filled 2018-04-07: qty 30

## 2018-04-07 MED ORDER — HALOPERIDOL LACTATE 5 MG/ML IJ SOLN
5.0000 mg | Freq: Once | INTRAMUSCULAR | Status: AC
  Administered 2018-04-07: 5 mg via INTRAVENOUS
  Filled 2018-04-07: qty 1

## 2018-04-07 MED ORDER — LIDOCAINE VISCOUS HCL 2 % MT SOLN
15.0000 mL | Freq: Once | OROMUCOSAL | Status: AC
  Administered 2018-04-07: 15 mL via ORAL
  Filled 2018-04-07: qty 15

## 2018-04-07 MED ORDER — KETOROLAC TROMETHAMINE 30 MG/ML IJ SOLN
30.0000 mg | Freq: Once | INTRAMUSCULAR | Status: AC
  Administered 2018-04-07: 30 mg via INTRAVENOUS
  Filled 2018-04-07: qty 1

## 2018-04-07 MED ORDER — FENTANYL CITRATE (PF) 100 MCG/2ML IJ SOLN
50.0000 ug | Freq: Once | INTRAMUSCULAR | Status: AC
  Administered 2018-04-07: 50 ug via INTRAVENOUS
  Filled 2018-04-07: qty 2

## 2018-04-07 MED ORDER — VALACYCLOVIR HCL 1 G PO TABS: 1000.0000 mg | ORAL_TABLET | Freq: Three times a day (TID) | ORAL | 0 refills | Status: DC

## 2018-04-07 MED ORDER — SODIUM CHLORIDE 0.9% FLUSH
3.0000 mL | Freq: Once | INTRAVENOUS | Status: AC
  Administered 2018-04-07: 3 mL via INTRAVENOUS

## 2018-04-07 MED ORDER — KETOROLAC TROMETHAMINE 10 MG PO TABS: 10.0000 mg | ORAL_TABLET | Freq: Three times a day (TID) | ORAL | 0 refills | Status: DC | PRN

## 2018-04-07 MED ORDER — IOHEXOL 350 MG/ML SOLN
75.0000 mL | Freq: Once | INTRAVENOUS | Status: AC | PRN
  Administered 2018-04-07: 75 mL via INTRAVENOUS

## 2018-04-07 NOTE — ED Notes (Signed)
Pt appeared to have some relief from pain medication initially- pt's breathing became easy and pt was able to lie still Pt now sitting up in bed moaning again and has had an episode of emesis

## 2018-04-07 NOTE — ED Triage Notes (Signed)
Pt presents to ED via POV with c/o sudden onset substernal CP with radiation to L arm and L side of her neck. Pt c/o SOB with the chest pain, states it feels like a weight is on her chest and feels like she cannot breathe.

## 2018-04-07 NOTE — Discharge Instructions (Signed)
Please seek medical attention for any high fevers, chest pain, shortness of breath, change in behavior, persistent vomiting, bloody stool or any other new or concerning symptoms.  

## 2018-04-07 NOTE — ED Provider Notes (Signed)
[facility] Emergency Department Provider Note  ____________________________________________   I have reviewed the triage vital signs and the nursing notes.   HISTORY  Chief Complaint Chest Pain   History limited by: Not Limited   HPI Caroline Garcia is a 58 y.o. female who presents to the emergency department today because of concerns for chest pain.  Pain started roughly 3 hours prior to my evaluation.  Is located in the center chest.  She describes it as pressure-like.  It is worse with movement.  She also feels like the pain is worse when she tries to take a deep breath.  She denies any cough.  The patient denies any nausea or vomiting.  She denies ever having pain like this before.  She denies any trauma to her chest.  Per medical record review patient has a history of UTI  Past Medical History:  Diagnosis Date  . PONV (postoperative nausea and vomiting)   . UTI (urinary tract infection)    hx    Patient Active Problem List   Diagnosis Date Noted  . Cervical spondylosis with radiculopathy 04/26/2012    Class: Diagnosis of    Past Surgical History:  Procedure Laterality Date  . ANTERIOR CERVICAL DECOMP/DISCECTOMY FUSION N/A 04/29/2012   Procedure: C5-6, C6-7 Anterior Cervical Discectomy and Fusion, Allograft, Plate;  Surgeon: Eldred Manges, MD;  Location: [facility] OR;  Service: Orthopedics;  Laterality: N/A;  C5-6, C6-7 Anterior Cervical Discectomy and Fusion, Allograft and Plate  . CHOLECYSTECTOMY      Prior to Admission medications   Medication Sig Start Date End Date Taking? Authorizing Provider  methocarbamol (ROBAXIN) 500 MG tablet Take 1 tablet (500 mg total) by mouth every 6 (six) hours as needed (spasm). Patient not taking: Reported on 05/05/2017 04/29/12   Maud Deed, PA-C  ondansetron (ZOFRAN) 4 MG tablet Take 1 tablet (4 mg total) by mouth every 8 (eight) hours as needed for nausea or vomiting. 05/05/17   Jeanmarie Plant, MD   oxyCODONE-acetaminophen (ROXICET) 5-325 MG per tablet Take 1-2 tablets by mouth every 4 (four) hours as needed for pain. Patient not taking: Reported on 05/05/2017 04/29/12   Maud Deed, PA-C    Allergies Patient has no known allergies.  No family history on file.  Social History Social History   Tobacco Use  . Smoking status: Never Smoker  . Smokeless tobacco: Never Used  Substance Use Topics  . Alcohol use: No  . Drug use: No    Review of Systems Constitutional: No fever/chills Eyes: No visual changes. ENT: No sore throat. Cardiovascular: Positive for chest pain. Respiratory: Positive for shortness of breath. Gastrointestinal: No abdominal pain.  No nausea, no vomiting.  No diarrhea.   Genitourinary: Negative for dysuria. Musculoskeletal: Negative for back pain. Skin: Negative for rash. Neurological: Negative for headaches, focal weakness or numbness.  ____________________________________________   PHYSICAL EXAM:  VITAL SIGNS: ED Triage Vitals  Enc Vitals Group     BP 04/07/18 1453 (!) 152/87     Pulse Rate 04/07/18 1453 78     Resp 04/07/18 1453 18     Temp 04/07/18 1453 98 F (36.7 C)     Temp Source 04/07/18 1453 Oral     SpO2 04/07/18 1453 100 %     Weight 04/07/18 1454 180 lb (81.6 kg)     Height 04/07/18 1454 5\' 7"  (1.702 m)     Head Circumference --      Peak Flow --  Pain Score 04/07/18 1454 10    Constitutional: Alert and oriented.  Eyes: Conjunctivae are normal.  ENT      Head: Normocephalic and atraumatic.      Nose: No congestion/rhinnorhea.      Mouth/Throat: Mucous membranes are moist.      Neck: No stridor. Hematological/Lymphatic/Immunilogical: No cervical lymphadenopathy. Cardiovascular: Normal rate, regular rhythm.  No murmurs, rubs, or gallops.  Respiratory: Normal respiratory effort without tachypnea nor retractions. Breath sounds are clear and equal bilaterally. No wheezes/rales/rhonchi. Gastrointestinal: Soft and tender to  palpation in the upper abdomen.  Genitourinary: Deferred Musculoskeletal: Normal range of motion in all extremities. No lower extremity edema. Neurologic:  Normal speech and language. No gross focal neurologic deficits are appreciated.  Skin:  Skin is warm, dry and intact. No rash noted. Psychiatric: Mood and affect are normal. Speech and behavior are normal. Patient exhibits appropriate insight and judgment.  ____________________________________________    LABS (pertinent positives/negatives)  Trop <0.03 x 2 CBC wbc 13.1, hgb 13.8, plt 342 BMP wnl except cr 1.15 D-dimer 268.19 Lipase 25 LFTs wnl ____________________________________________   EKG  I, Phineas Semen, attending physician, personally viewed and interpreted this EKG  EKG Time: 1447 Rate: 92 Rhythm: normal sinus rhythm Axis: normal Intervals: qtc 427 QRS: narrow ST changes: no st elevation Impression: normal ekg   ____________________________________________    RADIOLOGY  CXR No acute findings  CT angio No acute findings  ____________________________________________   PROCEDURES  Procedures  ____________________________________________   INITIAL IMPRESSION / ASSESSMENT AND PLAN / ED COURSE  Pertinent labs & imaging results that were available during my care of the patient were reviewed by me and considered in my medical decision making (see chart for details).   Patient presented to the emergency department today because of concerns for severe onset of the left sided chest pain with some radiation to her abdomen.  On exam patient appeared uncomfortable and did have tenderness, and abdomen.  Differential would be broad.  Work-up had troponin negative x2.  Chest x-ray was negative.  Given tenting pain a d-dimer was sent which was negative.  Patient underwent a CT angios to evaluate for dissection.  This was also negative.  Patient did eventually get some relief with Toradol.  At this point  unclear etiology of the patient's pain however broad work-up was negative.  I do think it is safe for patient to go home.  Given that she got some relief with Toradol will give patient prescription for Toradol.  Also give patient prescription for Valtrex in case this is more shingles type pain.  Did discuss with patient that she does not need to fill it unless she notices a rash erupt.  Discussed with patient portance of primary care follow-up.   ____________________________________________   FINAL CLINICAL IMPRESSION(S) / ED DIAGNOSES  Final diagnoses:  Nonspecific chest pain  Pain     Note: This dictation was prepared with Dragon dictation. Any transcriptional errors that result from this process are unintentional    Phineas Semen, MD 04/07/18 2044

## 2018-04-09 ENCOUNTER — Inpatient Hospital Stay
Admission: EM | Admit: 2018-04-09 | Discharge: 2018-04-11 | DRG: 287 | Disposition: A | Discharge status: Home / Self Care | Payer: BLUE CROSS/BLUE SHIELD | Attending: Internal Medicine | Admitting: Internal Medicine

## 2018-04-09 ENCOUNTER — Other Ambulatory Visit: Payer: Self-pay

## 2018-04-09 ENCOUNTER — Encounter
Admission: EM | Disposition: A | Discharge status: Home / Self Care | Payer: Self-pay | Source: Home / Self Care | Attending: Internal Medicine

## 2018-04-09 ENCOUNTER — Emergency Department: Payer: BLUE CROSS/BLUE SHIELD

## 2018-04-09 ENCOUNTER — Encounter: Payer: Self-pay | Admitting: Emergency Medicine

## 2018-04-09 DIAGNOSIS — I259 Chronic ischemic heart disease, unspecified: Secondary | ICD-10-CM | POA: Diagnosis not present

## 2018-04-09 DIAGNOSIS — R079 Chest pain, unspecified: Secondary | ICD-10-CM | POA: Diagnosis not present

## 2018-04-09 DIAGNOSIS — I319 Disease of pericardium, unspecified: Secondary | ICD-10-CM | POA: Diagnosis present

## 2018-04-09 DIAGNOSIS — I219 Acute myocardial infarction, unspecified: Secondary | ICD-10-CM | POA: Diagnosis not present

## 2018-04-09 DIAGNOSIS — Z79899 Other long term (current) drug therapy: Secondary | ICD-10-CM | POA: Diagnosis not present

## 2018-04-09 DIAGNOSIS — Z87440 Personal history of urinary (tract) infections: Secondary | ICD-10-CM

## 2018-04-09 DIAGNOSIS — I361 Nonrheumatic tricuspid (valve) insufficiency: Secondary | ICD-10-CM | POA: Diagnosis not present

## 2018-04-09 DIAGNOSIS — D72829 Elevated white blood cell count, unspecified: Secondary | ICD-10-CM | POA: Diagnosis not present

## 2018-04-09 DIAGNOSIS — R7989 Other specified abnormal findings of blood chemistry: Secondary | ICD-10-CM | POA: Diagnosis not present

## 2018-04-09 DIAGNOSIS — I214 Non-ST elevation (NSTEMI) myocardial infarction: Secondary | ICD-10-CM | POA: Diagnosis not present

## 2018-04-09 DIAGNOSIS — Z981 Arthrodesis status: Secondary | ICD-10-CM

## 2018-04-09 DIAGNOSIS — I340 Nonrheumatic mitral (valve) insufficiency: Secondary | ICD-10-CM | POA: Diagnosis not present

## 2018-04-09 DIAGNOSIS — Z9049 Acquired absence of other specified parts of digestive tract: Secondary | ICD-10-CM | POA: Diagnosis not present

## 2018-04-09 DIAGNOSIS — I514 Myocarditis, unspecified: Secondary | ICD-10-CM | POA: Diagnosis not present

## 2018-04-09 HISTORY — PX: LEFT HEART CATH AND CORONARY ANGIOGRAPHY: CATH118249

## 2018-04-09 LAB — BASIC METABOLIC PANEL
Anion gap: 11 (ref 5–15)
BUN: 16 mg/dL (ref 6–20)
CALCIUM: 9.1 mg/dL (ref 8.9–10.3)
CO2: 20 mmol/L — AB (ref 22–32)
Chloride: 106 mmol/L (ref 98–111)
Creatinine, Ser: 0.94 mg/dL (ref 0.44–1.00)
GFR calc non Af Amer: >60 mL/min (ref >60)
Glucose, Bld: 93 mg/dL (ref 70–99)
Potassium: 3.5 mmol/L (ref 3.5–5.1)
Sodium: 137 mmol/L (ref 135–145)

## 2018-04-09 LAB — CBC
HCT: 40.2 % (ref 36.0–46.0)
Hemoglobin: 12.8 g/dL (ref 12.0–15.0)
MCH: 29.5 pg (ref 26.0–34.0)
MCHC: 31.8 g/dL (ref 30.0–36.0)
MCV: 92.6 fL (ref 80.0–100.0)
Platelets: 319 10*3/uL (ref 150–400)
RBC: 4.34 MIL/uL (ref 3.87–5.11)
RDW: 13.8 % (ref 11.5–15.5)
WBC: 15 10*3/uL — ABNORMAL HIGH (ref 4.0–10.5)
nRBC: 0 % (ref 0.0–0.2)

## 2018-04-09 LAB — HEMOGLOBIN A1C
Hgb A1c MFr Bld: 5.5 % (ref 4.8–5.6)
Mean Plasma Glucose: 111.15 mg/dL

## 2018-04-09 LAB — SEDIMENTATION RATE: Sed Rate: 50 mm/hr — ABNORMAL HIGH (ref 0–30)

## 2018-04-09 LAB — C-REACTIVE PROTEIN: CRP: 9 mg/dL — ABNORMAL HIGH (ref <1.0)

## 2018-04-09 LAB — TROPONIN I
Troponin I: 7.85 ng/mL (ref <0.03)
Troponin I: 15.66 ng/mL (ref <0.03)

## 2018-04-09 LAB — GLUCOSE, CAPILLARY: Glucose-Capillary: 117 mg/dL — ABNORMAL HIGH (ref 70–99)

## 2018-04-09 LAB — MRSA PCR SCREENING: MRSA BY PCR: NEGATIVE

## 2018-04-09 SURGERY — LEFT HEART CATH AND CORONARY ANGIOGRAPHY
Anesthesia: Moderate Sedation

## 2018-04-09 MED ORDER — ASPIRIN 81 MG PO CHEW
CHEWABLE_TABLET | ORAL | Status: AC
  Filled 2018-04-09: qty 4

## 2018-04-09 MED ORDER — ASPIRIN 81 MG PO CHEW
CHEWABLE_TABLET | ORAL | Status: DC | PRN
  Administered 2018-04-09: 324 mg via ORAL

## 2018-04-09 MED ORDER — NITROGLYCERIN 0.4 MG SL SUBL
0.4000 mg | SUBLINGUAL_TABLET | SUBLINGUAL | Status: DC | PRN
  Administered 2018-04-09: 0.4 mg via SUBLINGUAL
  Filled 2018-04-09: qty 1

## 2018-04-09 MED ORDER — SODIUM CHLORIDE 0.9% FLUSH
3.0000 mL | Freq: Two times a day (BID) | INTRAVENOUS | Status: DC
  Administered 2018-04-10 (×3): 3 mL via INTRAVENOUS

## 2018-04-09 MED ORDER — ASPIRIN 81 MG PO CHEW
81.0000 mg | CHEWABLE_TABLET | Freq: Every day | ORAL | Status: DC
  Administered 2018-04-10 – 2018-04-11 (×2): 81 mg via ORAL
  Filled 2018-04-09 (×2): qty 1

## 2018-04-09 MED ORDER — ACETAMINOPHEN 325 MG PO TABS
650.0000 mg | ORAL_TABLET | ORAL | Status: DC | PRN
  Administered 2018-04-10 – 2018-04-11 (×4): 650 mg via ORAL
  Filled 2018-04-09 (×4): qty 2

## 2018-04-09 MED ORDER — FENTANYL CITRATE (PF) 100 MCG/2ML IJ SOLN
INTRAMUSCULAR | Status: DC | PRN
  Administered 2018-04-09 (×4): 25 ug via INTRAVENOUS

## 2018-04-09 MED ORDER — SODIUM CHLORIDE 0.9% FLUSH: 3.0000 mL | INTRAVENOUS | Status: DC | PRN

## 2018-04-09 MED ORDER — FENTANYL CITRATE (PF) 100 MCG/2ML IJ SOLN
INTRAMUSCULAR | Status: AC
  Filled 2018-04-09: qty 2

## 2018-04-09 MED ORDER — NITROGLYCERIN 2 % TD OINT
0.5000 [in_us] | TOPICAL_OINTMENT | Freq: Four times a day (QID) | TRANSDERMAL | Status: DC
  Administered 2018-04-09 – 2018-04-11 (×7): 0.5 [in_us] via TOPICAL
  Filled 2018-04-09 (×6): qty 1

## 2018-04-09 MED ORDER — SODIUM CHLORIDE 0.9 % IV SOLN
INTRAVENOUS | Status: DC
  Administered 2018-04-09: 18:00:00 via INTRAVENOUS

## 2018-04-09 MED ORDER — SODIUM CHLORIDE 0.9 % IV SOLN: 250.0000 mL | INTRAVENOUS | Status: DC | PRN

## 2018-04-09 MED ORDER — ONDANSETRON HCL 4 MG/2ML IJ SOLN: 4.0000 mg | Freq: Four times a day (QID) | INTRAMUSCULAR | Status: DC | PRN

## 2018-04-09 MED ORDER — DEXTROSE 50 % IV SOLN
12.5000 g | Freq: Once | INTRAVENOUS | Status: AC
  Administered 2018-04-09: 12.5 g via INTRAVENOUS

## 2018-04-09 MED ORDER — ENOXAPARIN SODIUM 40 MG/0.4ML ~~LOC~~ SOLN
40.0000 mg | SUBCUTANEOUS | Status: DC
  Administered 2018-04-10 – 2018-04-11 (×2): 40 mg via SUBCUTANEOUS
  Filled 2018-04-09 (×2): qty 0.4

## 2018-04-09 MED ORDER — HEPARIN SODIUM (PORCINE) 1000 UNIT/ML IJ SOLN
INTRAMUSCULAR | Status: DC | PRN
  Administered 2018-04-09: 4000 [IU] via INTRAVENOUS

## 2018-04-09 MED ORDER — NITROGLYCERIN 5 MG/ML IV SOLN
INTRAVENOUS | Status: AC
  Filled 2018-04-09: qty 10

## 2018-04-09 MED ORDER — MIDAZOLAM HCL 2 MG/2ML IJ SOLN
INTRAMUSCULAR | Status: AC
  Filled 2018-04-09: qty 2

## 2018-04-09 MED ORDER — HEPARIN (PORCINE) IN NACL 1000-0.9 UT/500ML-% IV SOLN
INTRAVENOUS | Status: AC
  Filled 2018-04-09: qty 1000

## 2018-04-09 MED ORDER — HEPARIN SODIUM (PORCINE) 1000 UNIT/ML IJ SOLN
INTRAMUSCULAR | Status: AC
  Filled 2018-04-09: qty 1

## 2018-04-09 MED ORDER — VERAPAMIL HCL 2.5 MG/ML IV SOLN
INTRAVENOUS | Status: DC | PRN
  Administered 2018-04-09: 2.5 mg via INTRA_ARTERIAL

## 2018-04-09 MED ORDER — VERAPAMIL HCL 2.5 MG/ML IV SOLN
INTRAVENOUS | Status: AC
  Filled 2018-04-09: qty 2

## 2018-04-09 MED ORDER — SODIUM CHLORIDE 0.9% FLUSH: 3.0000 mL | Freq: Once | INTRAVENOUS | Status: DC

## 2018-04-09 MED ORDER — HEPARIN (PORCINE) IN NACL 1000-0.9 UT/500ML-% IV SOLN
INTRAVENOUS | Status: DC | PRN
  Administered 2018-04-09: 500 mL

## 2018-04-09 MED ORDER — COLCHICINE 0.6 MG PO TABS
0.6000 mg | ORAL_TABLET | Freq: Every day | ORAL | Status: DC
  Administered 2018-04-09 – 2018-04-11 (×3): 0.6 mg via ORAL
  Filled 2018-04-09 (×3): qty 1

## 2018-04-09 MED ORDER — ATORVASTATIN CALCIUM 20 MG PO TABS
40.0000 mg | ORAL_TABLET | Freq: Every day | ORAL | Status: DC
  Administered 2018-04-09 – 2018-04-10 (×2): 40 mg via ORAL
  Filled 2018-04-09: qty 2

## 2018-04-09 MED ORDER — MIDAZOLAM HCL 2 MG/2ML IJ SOLN
INTRAMUSCULAR | Status: DC | PRN
  Administered 2018-04-09: 1 mg via INTRAVENOUS
  Administered 2018-04-09 (×2): 0.5 mg via INTRAVENOUS

## 2018-04-09 SURGICAL SUPPLY — 17 items
CANNULA 5F STIFF (CANNULA) ×2 IMPLANT
CATH INFINITI 5 FR JL3.5 (CATHETERS) ×2 IMPLANT
CATH INFINITI 5FR ANG PIGTAIL (CATHETERS) ×3 IMPLANT
CATH INFINITI JR4 5F (CATHETERS) ×2 IMPLANT
CATH LAUNCHER 6FR EBU 3 (CATHETERS) ×2 IMPLANT
DEVICE CLOSURE MYNXGRIP 5F (Vascular Products) ×2 IMPLANT
DEVICE INFLAT 30 PLUS (MISCELLANEOUS) ×2 IMPLANT
DEVICE RAD TR BAND REGULAR (VASCULAR PRODUCTS) ×2 IMPLANT
GLIDESHEATH SLEND SS 6F .021 (SHEATH) ×2 IMPLANT
KIT MANI 3VAL PERCEP (MISCELLANEOUS) ×3 IMPLANT
NDL PERC 18GX7CM (NEEDLE) IMPLANT
NEEDLE PERC 18GX7CM (NEEDLE) ×3 IMPLANT
PACK CARDIAC CATH (CUSTOM PROCEDURE TRAY) ×3 IMPLANT
SHEATH AVANTI 5FR X 11CM (SHEATH) ×2 IMPLANT
WIRE GUIDERIGHT .035X150 (WIRE) ×2 IMPLANT
WIRE HITORQ VERSACORE ST 145CM (WIRE) ×2 IMPLANT
WIRE ROSEN-J .035X260CM (WIRE) ×2 IMPLANT

## 2018-04-09 NOTE — ED Provider Notes (Signed)
[facility] Emergency Department Provider Note ____________________________________________   First MD Initiated Contact with Patient 04/09/18 1638     (approximate)  I have reviewed the triage vital signs and the nursing notes.   HISTORY  Chief Complaint Chest Pain    HPI Caroline Garcia is a 58 y.o. female with PMH as noted below who presents with chest pain over the last several days.  The patient was seen in the ED 2 days ago with negative work-up at that time.  She states that the pain returned today and became more severe.  Radiates to left arm.  It is not associated with shortness of breath.  She has had no prior episodes of this pain.  She has no prior cardiac history.  Past Medical History:  Diagnosis Date  . PONV (postoperative nausea and vomiting)   . UTI (urinary tract infection)    hx    Patient Active Problem List   Diagnosis Date Noted  . Cervical spondylosis with radiculopathy 04/26/2012    Class: Diagnosis of    Past Surgical History:  Procedure Laterality Date  . ANTERIOR CERVICAL DECOMP/DISCECTOMY FUSION N/A 04/29/2012   Procedure: C5-6, C6-7 Anterior Cervical Discectomy and Fusion, Allograft, Plate;  Surgeon: Eldred Manges, MD;  Location: [facility] OR;  Service: Orthopedics;  Laterality: N/A;  C5-6, C6-7 Anterior Cervical Discectomy and Fusion, Allograft and Plate  . CHOLECYSTECTOMY      Prior to Admission medications   Medication Sig Start Date End Date Taking? Authorizing Provider  ketorolac (TORADOL) 10 MG tablet Take 1 tablet (10 mg total) by mouth every 8 (eight) hours as needed for severe pain. 04/07/18   Phineas Semen, MD  methocarbamol (ROBAXIN) 500 MG tablet Take 1 tablet (500 mg total) by mouth every 6 (six) hours as needed (spasm). Patient not taking: Reported on 05/05/2017 04/29/12   Maud Deed, PA-C  ondansetron (ZOFRAN) 4 MG tablet Take 1 tablet (4 mg total) by mouth every 8 (eight) hours as needed for nausea or  vomiting. 05/05/17   Jeanmarie Plant, MD  oxyCODONE-acetaminophen (ROXICET) 5-325 MG per tablet Take 1-2 tablets by mouth every 4 (four) hours as needed for pain. Patient not taking: Reported on 05/05/2017 04/29/12   Maud Deed, PA-C  valACYclovir (VALTREX) 1000 MG tablet Take 1 tablet (1,000 mg total) by mouth 3 (three) times daily for 7 days. 04/07/18 04/14/18  Phineas Semen, MD    Allergies Patient has no known allergies.  No family history on file.  Social History Social History   Tobacco Use  . Smoking status: Never Smoker  . Smokeless tobacco: Never Used  Substance Use Topics  . Alcohol use: No  . Drug use: No    Review of Systems  Constitutional: No fever. Eyes: No redness. ENT: No neck pain. Cardiovascular: Positive for chest pain. Respiratory: Denies shortness of breath. Gastrointestinal: No vomiting or diarrhea.  Genitourinary: Negative for flank pain.  Musculoskeletal: Negative for back pain. Skin: Negative for rash. Neurological: Negative for headache.   ____________________________________________   PHYSICAL EXAM:  VITAL SIGNS: ED Triage Vitals  Enc Vitals Group     BP 04/09/18 1528 138/78     Pulse Rate 04/09/18 1528 61     Resp 04/09/18 1528 16     Temp 04/09/18 1528 98 F (36.7 C)     Temp Source 04/09/18 1528 Oral     SpO2 04/09/18 1528 100 %     Weight --      Height --  Head Circumference --      Peak Flow --      Pain Score 04/09/18 1527 10     Pain Loc --      Pain Edu? --      Excl. in GC? --     Constitutional: Alert and oriented. Well appearing and in no acute distress. Eyes: Conjunctivae are normal.  Head: Atraumatic. Nose: No congestion/rhinnorhea. Mouth/Throat: Mucous membranes are moist.   Neck: Normal range of motion.  Cardiovascular: Normal rate, regular rhythm. Grossly normal heart sounds.  Good peripheral circulation. Respiratory: Normal respiratory effort.  No retractions. Lungs CTAB. Gastrointestinal: No  distention.  Musculoskeletal: Extremities warm and well perfused.  Neurologic:  Normal speech and language. No gross focal neurologic deficits are appreciated.  Skin:  Skin is warm and dry. No rash noted. Psychiatric: Mood and affect are normal. Speech and behavior are normal.  ____________________________________________   LABS (all labs ordered are listed, but only abnormal results are displayed)  Labs Reviewed  BASIC METABOLIC PANEL - Abnormal; Notable for the following components:      Result Value   CO2 20 (*)    All other components within normal limits  CBC - Abnormal; Notable for the following components:   WBC 15.0 (*)    All other components within normal limits  TROPONIN I - Abnormal; Notable for the following components:   Troponin I 7.85 (*)    All other components within normal limits   ____________________________________________  EKG  ED ECG REPORT I, Dionne Bucy, the attending physician, personally viewed and interpreted this ECG.  Date: 04/09/2018 EKG Time: 1528 Rate: 62 Rhythm: normal sinus rhythm QRS Axis: normal Intervals: normal ST/T Wave abnormalities: ST elevation approximately 1 mm in I, II, aVL Narrative Interpretation: Borderline ST elevation concerning for acute ischemia  ____________________________________________  RADIOLOGY    ____________________________________________   PROCEDURES  Procedure(s) performed: No  Procedures  Critical Care performed: Yes  CRITICAL CARE Performed by: Dionne Bucy   Total critical care time: 30 minutes  Critical care time was exclusive of separately billable procedures and treating other patients.  Critical care was necessary to treat or prevent imminent or life-threatening deterioration.  Critical care was time spent personally by me on the following activities: development of treatment plan with patient and/or surrogate as well as nursing, discussions with consultants,  evaluation of patient's response to treatment, examination of patient, obtaining history from patient or surrogate, ordering and performing treatments and interventions, ordering and review of laboratory studies, ordering and review of radiographic studies, pulse oximetry and re-evaluation of patient's condition. ____________________________________________   INITIAL IMPRESSION / ASSESSMENT AND PLAN / ED COURSE  Pertinent labs & imaging results that were available during my care of the patient were reviewed by me and considered in my medical decision making (see chart for details).  58 year old female with PMH as noted above presents with recurrent chest pain today after being evaluated in the ED several days ago with atypical chest pain and negative work-up.  Initial EKG from triage was somewhat nonspecific although there were possible new ST changes when compared to EKG from 2 days ago, not meeting STEMI criteria at that time.  I reviewed the past medical records in Epic.  The patient has no prior cardiac history.  She was seen in the ED for chest pain on 04/07/2018.  Lab work-up and CT angiogram were negative.  Them today the patient is uncomfortable but relatively well-appearing with normal vital signs.  The remainder of  the exam is as described above.  At some point after the patient had been triagedI was told that the patient was a possible ST elevation MI and was being brought back to the room.  At that time I was given the EKG.  After reviewing it and speaking to the patient I immediately activated the STEMI team and consulted with Dr. Okey DupreEnd who immediately came to evaluate the patient and took her to the cath lab. ____________________________________________   FINAL CLINICAL IMPRESSION(S) / ED DIAGNOSES  Final diagnoses:  Myocardial infarction, unspecified MI type, unspecified artery (HCC)      NEW MEDICATIONS STARTED DURING THIS VISIT:  Current Discharge Medication List        Note:  This document was prepared using Dragon voice recognition software and may include unintentional dictation errors.    Dionne BucySiadecki, Aleiah Mohammed, MD 04/09/18 [phone]

## 2018-04-09 NOTE — Progress Notes (Signed)
[facility]  Date: 04/09/18 Time: 8:51 PM  I was contacted by the patient's nurse regarding recurrent chest pain.  Patient developed mild CP around 8 PM, which resolved with SL NTG x 1.  EKG shows widespread ST elevation as well as subtle PR depression.  Based on presenting symptoms, cath findings, and EKG appearance, I am most suspicious for myopericarditis.  Colchicine has been ordered.  I will also place NTG paste, in case there is an element of coronary vasospasm.  Continue close monitoring in the CCU.  Yvonne Kendall, MD [facility] Pager: [phone]

## 2018-04-09 NOTE — Progress Notes (Signed)
Code STEMI. Chaplain provided spiritual care support to young daughter til husband and son arrived. Offered supportive presence during procedure to offer communication with medication team. Aided family in getting to waiting area and to ICU room. Prayer offered with Cletta prior to procedure.     04/09/18 1900  Clinical Encounter Type  Visited With Patient and family together  Visit Type Code  Referral From Nurse  Spiritual Encounters  Spiritual Needs Emotional

## 2018-04-09 NOTE — ED Notes (Signed)
Dr. Okey Dupre, Cardiologist at bedside explaining procedure. Pt giving verbal consent to procedure. Pt verbalized understanding of risk and benefits at this time. Emergent procedure. This RN at bedside as witness. EDP Siadecki at bedside and aware of procedure.

## 2018-04-09 NOTE — Consult Note (Signed)
Cardiology Consultation:   Patient ID: Caroline Garcia MRN: [id number]; DOB: September 11, 1960  Admit date: 04/09/2018 Date of Consult: 04/09/2018  Primary Care Provider: Patient, No Pcp Per Primary Cardiologist: New - Dwayna Kentner Primary Electrophysiologist:  None    Patient Profile:   Caroline Garcia is a 58 y.o. female with no significant past medical history who is being seen today for the evaluation of chest pain at the request of [employer].  History of Present Illness:   Caroline Garcia reports developing intermittent chest and left arm pain ~3 days ago.  She was seen in the ED on 04/07/18 and was diagnosed with shingles.  She also tried OTC antacids without improvement.  CTA of the C/A/P was negative at that time.  She has continued to have intermittent pain, which became more severe today (10/10 substernal chest pressure).  Other than taking a deep breath, which worsens the pain, there are no obvious exacerbating or alleviating factors.  She presented to the ED and was found to have lateral ST segment elevation and a troponin of 7.9.  However, both chest pain and EKG changes improved spontaneously.  However, given presentation and elevated troponin, the patient underwent emergent LHC, which did not show any significant CAD.  LVEF was also normal.  Caroline Garcia denies a history of cardiac disease or prior testing.  She has not felt short of breath, though she notes that trying to take a deep breath at times has been painful.  She denies palpitations and lightheadedness, as well as orthopnea, PND, and edema.  Past Medical History:  Diagnosis Date  . PONV (postoperative nausea and vomiting)   . UTI (urinary tract infection)    hx    Past Surgical History:  Procedure Laterality Date  . ANTERIOR CERVICAL DECOMP/DISCECTOMY FUSION N/A 04/29/2012   Procedure: C5-6, C6-7 Anterior Cervical Discectomy and Fusion, Allograft, Plate;  Surgeon: Marybelle Killings, MD;  Location: [facility];  Service: Orthopedics;   Laterality: N/A;  C5-6, C6-7 Anterior Cervical Discectomy and Fusion, Allograft and Plate  . CHOLECYSTECTOMY       Home Medications:  Prior to Admission medications   Medication Sig Start Date Axavier Pressley Date Taking? Authorizing Provider  ketorolac (TORADOL) 10 MG tablet Take 1 tablet (10 mg total) by mouth every 8 (eight) hours as needed for severe pain. 04/07/18   Nance Pear, MD  methocarbamol (ROBAXIN) 500 MG tablet Take 1 tablet (500 mg total) by mouth every 6 (six) hours as needed (spasm). Patient not taking: Reported on 05/05/2017 04/29/12   Phillips Hay, PA-C  ondansetron (ZOFRAN) 4 MG tablet Take 1 tablet (4 mg total) by mouth every 8 (eight) hours as needed for nausea or vomiting. 05/05/17   Schuyler Amor, MD  oxyCODONE-acetaminophen (ROXICET) 5-325 MG per tablet Take 1-2 tablets by mouth every 4 (four) hours as needed for pain. Patient not taking: Reported on 05/05/2017 04/29/12   Phillips Hay, PA-C  valACYclovir (VALTREX) 1000 MG tablet Take 1 tablet (1,000 mg total) by mouth 3 (three) times daily for 7 days. 04/07/18 04/14/18  Nance Pear, MD    Inpatient Medications: Scheduled Meds: . aspirin  81 mg Oral Daily  . [START ON 04/10/2018] atorvastatin  40 mg Oral q1800  . [START ON 04/10/2018] enoxaparin (LOVENOX) injection  40 mg Subcutaneous Q24H  . nitroGLYCERIN  0.5 inch Topical Q6H  . sodium chloride flush  3 mL Intravenous Q12H   Continuous Infusions: . sodium chloride 75 mL/hr at 04/09/18 1827  . sodium chloride  PRN Meds: sodium chloride, acetaminophen, nitroGLYCERIN, ondansetron (ZOFRAN) IV, sodium chloride flush  Allergies:   No Known Allergies  Social History:   Social History   Tobacco Use  . Smoking status: Never Smoker  . Smokeless tobacco: Never Used  Substance Use Topics  . Alcohol use: No  . Drug use: No     Family History:   Patient's father and grandmother both had heart disease (details unknown).  ROS:  Patient notes nausea last week and was  concerned that she may have contracted a viral illness that was going around her place of work.  All other ROS reviewed and negative except as noted in the HPI.Marland Kitchen     Physical Exam/Data:   Vitals:   04/09/18 1641 04/09/18 1644 04/09/18 1822 04/09/18 2000  BP: (!) 128/96 131/84 115/75 127/74  Pulse: 73 81 (!) 51 (!) 55  Resp: (!) _0 Temp:   97.7 F (36.5 C)   TempSrc:   Oral   SpO2: 97% 97% 100% 100%  Weight:   85 kg   Height:   _1  (1.702 m)    No intake or output data in the 24 hours ending 04/09/18 2026 Last 3 Weights 04/09/2018 04/07/2018 05/05/2017  Weight (lbs) 187 lb 6.3 oz 180 lb 170 lb  Weight (kg) 85 kg 81.647 kg 77.111 kg     Body mass index is 29.35 kg/m.  General:  Well nourished, well developed, in no acute distress. HEENT: normal Lymph: no adenopathy Neck: no JVD Endocrine:  No thryomegaly Vascular: No carotid bruits; FA pulses 2+ bilaterally without bruits  Cardiac:  normal S1, S2; RRR; no murmur, rub, or gallop. Lungs:  clear to auscultation bilaterally, no wheezing, rhonchi or rales  Abd: soft, nontender, no hepatomegaly  Ext: no edema Musculoskeletal:  No deformities, BUE and BLE strength normal and equal Skin: warm and dry  Neuro:  CNs 2-12 intact, no focal abnormalities noted Psych:  Normal affect   EKG:  The EKG was personally reviewed and demonstrates:  NSR with non-specific ST segment changes. Telemetry:  Telemetry was personally reviewed and demonstrates:  NSR and sinus bradycardia.  Relevant CV Studies: LHC (04/09/2018): 1. No angiographically significant coronary artery disease to explain patient's chest pain, EKG changes, and elevated troponin.  Question vasospasm versus myopericarditis. 2. Normal left ventricular systolic function and filling pressure.  Laboratory Data:  Chemistry Recent Labs  Lab 04/07/18 1455 04/09/18 1534  NA 139 137  K 4.6 3.5  CL 106 106  CO2 27 20*  GLUCOSE 92 93  BUN 16 16  CREATININE 1.15* 0.94    CALCIUM 9.6 9.1  GFRNONAA 53* >60  GFRAA >60 >60  ANIONGAP 6 11    Recent Labs  Lab 04/07/18 1604  PROT 7.0  ALBUMIN 3.7  AST 20  ALT 16  ALKPHOS 46  BILITOT 0.5   Hematology Recent Labs  Lab 04/07/18 1455 04/09/18 1534  WBC 13.1* 15.0*  RBC 4.73 4.34  HGB 13.8 12.8  HCT 42.9 40.2  MCV 90.7 92.6  MCH 29.2 29.5  MCHC 32.2 31.8  RDW 13.6 13.8  PLT 342 319   Cardiac Enzymes Recent Labs  Lab 04/07/18 1455 04/07/18 1939 04/09/18 1534 04/09/18 1838  TROPONINI <0.03 <0.03 7.85* 15.66*   No results for input(s): TROPIPOC in the last 168 hours.  BNPNo results for input(s): BNP, PROBNP in the last 168 hours.  DDimer No results for input(s): DDIMER in the last 168 hours.  Radiology/Studies:  Dg Chest 2 View  Result Date: 04/09/2018 CLINICAL DATA:  Left chest pain EXAM: CHEST - 2 VIEW COMPARISON:  April 07, 2018 FINDINGS: The heart size and mediastinal contours are within normal limits. There is mild atelectasis of left lung base. No focal infiltrate, pulmonary edema, or pleural effusion. The visualized skeletal structures are stable. IMPRESSION: Mild linear opacity of left lung base, favor atelectasis. Electronically Signed   By: Abelardo Diesel M.D.   On: 04/09/2018 16:04   Dg Chest 2 View  Result Date: 04/07/2018 CLINICAL DATA:  Sudden onset of substernal chest pain radiating to the left arm and neck. Shortness of breath. EXAM: CHEST - 2 VIEW COMPARISON:  04/24/2012 FINDINGS: Interval lower cervical plate and screw fixator placement. Thoracic spondylosis. The lungs appear clear.  Cardiac and mediastinal contours normal. No pleural effusion identified. IMPRESSION: 1.  No active cardiopulmonary disease is radiographically apparent. 2. Lower cervical plate and screw fixator. 3. Thoracic spondylosis. Electronically Signed   By: Van Clines M.D.   On: 04/07/2018 15:53   Ct Angio Chest/abd/pel For Dissection W And/or Wo Contrast  Result Date: 04/07/2018 CLINICAL DATA:   Substernal chest pain. Pain in left side of neck and in left arm. EXAM: CT ANGIOGRAPHY CHEST, ABDOMEN AND PELVIS TECHNIQUE: Multidetector CT imaging through the chest, abdomen and pelvis was performed using the standard protocol during bolus administration of intravenous contrast. Multiplanar reconstructed images and MIPs were obtained and reviewed to evaluate the vascular anatomy. CONTRAST:  62m OMNIPAQUE IOHEXOL 350 MG/ML SOLN COMPARISON:  None. FINDINGS: CTA CHEST FINDINGS Cardiovascular: Heart is normal size. Aorta is normal caliber. No dissection. No filling defects in the pulmonary arteries to suggest pulmonary emboli. Mediastinum/Nodes: No mediastinal, hilar, or axillary adenopathy. Lungs/Pleura: No confluent opacities or effusions. Musculoskeletal: Chest wall soft tissues are unremarkable. No acute bony abnormality. Review of the MIP images confirms the above findings. CTA ABDOMEN AND PELVIS FINDINGS VASCULAR Aorta: Aorta is normal caliber.  No dissection. Celiac: Widely patent SMA: Widely patent Renals: Single bilaterally, widely patent IMA: Widely patent Inflow: No aneurysm or dissection. Veins: Grossly patent.  Retroaortic left renal vein noted. Review of the MIP images confirms the above findings. NON-VASCULAR Hepatobiliary: No focal liver abnormality is seen. Status post cholecystectomy. No biliary dilatation. Pancreas: No focal abnormality or ductal dilatation. Spleen: No focal abnormality.  Normal size. Adrenals/Urinary Tract: No adrenal abnormality. No focal renal abnormality. No stones or hydronephrosis. Urinary bladder is unremarkable. Stomach/Bowel: Normal appendix. Stomach, large and small bowel grossly unremarkable. Lymphatic: No adenopathy Reproductive: Probable left ovarian cyst measuring 3.7 cm. Uterus grossly unremarkable. No right adnexal mass. Other: No free fluid or free air. Musculoskeletal: No acute bony abnormality. Review of the MIP images confirms the above findings. IMPRESSION: No  evidence of aortic aneurysm or dissection. No acute cardiopulmonary disease. No acute findings in the abdomen or pelvis. 3.7 cm left ovarian cyst. Electronically Signed   By: KRolm BaptiseM.D.   On: 04/07/2018 19:16    Assessment and Plan:  Chest pain and elevated troponin Symptoms, dynamic EKG changes, and elevated troponin are certainly concerning for ACS, though catheterization did not show a culprit lesion.  Occluded small vessel could be the cause, though I would have expected to see a wall motion abnormality on the left ventriculogram.  Other potential causes include myopericarditis.  Monitor in stepdown.  ASA 81 mg daily; defer adding P2Y12 pending echocardiogram in case there is evidence of pericardial effusion.  PRN NTG for chest pain.  Start colchicine 0.6  mg daily.  Obtain transthoracic echocardiogram.  Trend troponin until it has peaked, then stop.  Check CRP and ESR.  Start atorvastatin 40 mg daily.  Check lipid panel and hemoglobin A1c for risk stratification.  Obtain urine drug screen.  For questions or updates, please contact [facility] Please consult [email or web address] for contact info under [facility] Cardiology.  Signed, Nelva Bush, MD  04/09/2018 8:26 PM

## 2018-04-09 NOTE — ED Notes (Signed)
Spoke with MD McShane about pt presentation, see orders

## 2018-04-09 NOTE — ED Triage Notes (Signed)
PT from [facility] with c/o worsening CP and LFT arm pain and numbness that started on Sunday, pt was seen w/ same symptoms and dx with possible shingles. PT denies any rash. VSS

## 2018-04-09 NOTE — ED Notes (Signed)
Charge nurse notified of elevated troponin

## 2018-04-09 NOTE — ED Notes (Signed)
Dr End cardiologist at bedside. 

## 2018-04-09 NOTE — Progress Notes (Signed)
Hypoglycemic Event  CBG: 68  Treatment: 12.5g D50  Symptoms: No obvious symptoms, pt drowsy but just arrived to unit from cath lab  Follow-up CBG: Time:1846 CBG Result:117  Possible Reasons for Event: Has been NPO   Comments/MD notified: Will recheck CBG in 15 minutes    Mukhtar Shams

## 2018-04-09 NOTE — H&P (Signed)
[facility] [place] at [facility]   PATIENT NAME: Caroline Garcia    MR#:  [id number]  DATE OF BIRTH:  12-30-1960  DATE OF ADMISSION:  04/09/2018  PRIMARY CARE PHYSICIAN: Patient, No Pcp Per   REQUESTING/REFERRING PHYSICIAN: Dr End  CHIEF COMPLAINT:  chest pain on and off several days  HISTORY OF PRESENT ILLNESS:  Caroline Garcia  is a 58 y.o. female with a known history of past medical history is listed below comes to the emergency room with chest pain on and off for several days. She was seen in the emergency room couple days ago and workup was negative at that time. Patient came back again since patient pain return and was radiating to her left arm. Denies any nausea vomiting or shortness of breath. She has no cardiac history.  Patient works as a Merchandiser, retail at [employer]. Currently no family in ICU troponin was 7.85. Cardiology was called Stat. Patient was taken to the Cath Lab. Her cardiac cath shows clean coronary arteries. She is been admitted in the ICU for further workup .  PAST MEDICAL HISTORY:   Past Medical History:  Diagnosis Date  . PONV (postoperative nausea and vomiting)   . UTI (urinary tract infection)    hx    PAST SURGICAL HISTOIRY:   Past Surgical History:  Procedure Laterality Date  . ANTERIOR CERVICAL DECOMP/DISCECTOMY FUSION N/A 04/29/2012   Procedure: C5-6, C6-7 Anterior Cervical Discectomy and Fusion, Allograft, Plate;  Surgeon: Eldred Manges, MD;  Location: [facility] OR;  Service: Orthopedics;  Laterality: N/A;  C5-6, C6-7 Anterior Cervical Discectomy and Fusion, Allograft and Plate  . CHOLECYSTECTOMY      SOCIAL HISTORY:   Social History   Tobacco Use  . Smoking status: Never Smoker  . Smokeless tobacco: Never Used  Substance Use Topics  . Alcohol use: No    FAMILY HISTORY:  No family history on file.  DRUG ALLERGIES:  No Known Allergies  REVIEW OF SYSTEMS:  Review of Systems  Constitutional: Negative for chills, fever  and weight loss.  HENT: Negative for ear discharge, ear pain and nosebleeds.   Eyes: Negative for blurred vision, pain and discharge.  Respiratory: Negative for sputum production, shortness of breath, wheezing and stridor.   Cardiovascular: Positive for chest pain. Negative for palpitations, orthopnea and PND.  Gastrointestinal: Negative for abdominal pain, diarrhea, nausea and vomiting.  Genitourinary: Negative for frequency and urgency.  Musculoskeletal: Negative for back pain and joint pain.  Neurological: Negative for sensory change, speech change, focal weakness and weakness.  Psychiatric/Behavioral: Negative for depression and hallucinations. The patient is not nervous/anxious.      MEDICATIONS AT HOME:   Prior to Admission medications   Medication Sig Start Date End Date Taking? Authorizing Provider  ketorolac (TORADOL) 10 MG tablet Take 1 tablet (10 mg total) by mouth every 8 (eight) hours as needed for severe pain. 04/07/18   Phineas Semen, MD  methocarbamol (ROBAXIN) 500 MG tablet Take 1 tablet (500 mg total) by mouth every 6 (six) hours as needed (spasm). Patient not taking: Reported on 05/05/2017 04/29/12   Maud Deed, PA-C  ondansetron (ZOFRAN) 4 MG tablet Take 1 tablet (4 mg total) by mouth every 8 (eight) hours as needed for nausea or vomiting. 05/05/17   Jeanmarie Plant, MD  oxyCODONE-acetaminophen (ROXICET) 5-325 MG per tablet Take 1-2 tablets by mouth every 4 (four) hours as needed for pain. Patient not taking: Reported on 05/05/2017 04/29/12   Maud Deed, PA-C  valACYclovir (VALTREX) 1000 MG tablet Take 1 tablet (1,000 mg total) by mouth 3 (three) times daily for 7 days. 04/07/18 04/14/18  Phineas Semen, MD      VITAL SIGNS:  Blood pressure 115/75, pulse (!) 51, temperature 97.7 F (36.5 C), temperature source Oral, resp. rate 14, height 5\' 7"  (1.702 m), weight 85 kg, SpO2 100 %.  PHYSICAL EXAMINATION:  GENERAL:  58 y.o.-year-old patient lying in the bed with no  acute distress.  EYES: Pupils equal, round, reactive to light and accommodation. No scleral icterus. Extraocular muscles intact.  HEENT: Head atraumatic, normocephalic. Oropharynx and nasopharynx clear.  NECK:  Supple, no jugular venous distention. No thyroid enlargement, no tenderness.  LUNGS: Normal breath sounds bilaterally, no wheezing, rales,rhonchi or crepitation. No use of accessory muscles of respiration.  CARDIOVASCULAR: S1, S2 normal. No murmurs, rubs, or gallops.  ABDOMEN: Soft, nontender, nondistended. Bowel sounds present. No organomegaly or mass.  EXTREMITIES: No pedal edema, cyanosis, or clubbing.  NEUROLOGIC: Cranial nerves II through XII are intact. Muscle strength 5/5 in all extremities. Sensation intact. Gait not checked.  PSYCHIATRIC: The patient is alert and oriented x 3.  SKIN: No obvious rash, lesion, or ulcer.   LABORATORY PANEL:   CBC Recent Labs  Lab 04/09/18 1534  WBC 15.0*  HGB 12.8  HCT 40.2  PLT 319   ------------------------------------------------------------------------------------------------------------------  Chemistries  Recent Labs  Lab 04/07/18 1604 04/09/18 1534  NA  --  137  K  --  3.5  CL  --  106  CO2  --  20*  GLUCOSE  --  93  BUN  --  16  CREATININE  --  0.94  CALCIUM  --  9.1  AST 20  --   ALT 16  --   ALKPHOS 46  --   BILITOT 0.5  --    ------------------------------------------------------------------------------------------------------------------  Cardiac Enzymes Recent Labs  Lab 04/09/18 1534  TROPONINI 7.85*   ------------------------------------------------------------------------------------------------------------------  RADIOLOGY:  Dg Chest 2 View  Result Date: 04/09/2018 CLINICAL DATA:  Left chest pain EXAM: CHEST - 2 VIEW COMPARISON:  April 07, 2018 FINDINGS: The heart size and mediastinal contours are within normal limits. There is mild atelectasis of left lung base. No focal infiltrate, pulmonary  edema, or pleural effusion. The visualized skeletal structures are stable. IMPRESSION: Mild linear opacity of left lung base, favor atelectasis. Electronically Signed   By: Sherian Rein M.D.   On: 04/09/2018 16:04   Ct Angio Chest/abd/pel For Dissection W And/or Wo Contrast  Result Date: 04/07/2018 CLINICAL DATA:  Substernal chest pain. Pain in left side of neck and in left arm. EXAM: CT ANGIOGRAPHY CHEST, ABDOMEN AND PELVIS TECHNIQUE: Multidetector CT imaging through the chest, abdomen and pelvis was performed using the standard protocol during bolus administration of intravenous contrast. Multiplanar reconstructed images and MIPs were obtained and reviewed to evaluate the vascular anatomy. CONTRAST:  18mL OMNIPAQUE IOHEXOL 350 MG/ML SOLN COMPARISON:  None. FINDINGS: CTA CHEST FINDINGS Cardiovascular: Heart is normal size. Aorta is normal caliber. No dissection. No filling defects in the pulmonary arteries to suggest pulmonary emboli. Mediastinum/Nodes: No mediastinal, hilar, or axillary adenopathy. Lungs/Pleura: No confluent opacities or effusions. Musculoskeletal: Chest wall soft tissues are unremarkable. No acute bony abnormality. Review of the MIP images confirms the above findings. CTA ABDOMEN AND PELVIS FINDINGS VASCULAR Aorta: Aorta is normal caliber.  No dissection. Celiac: Widely patent SMA: Widely patent Renals: Single bilaterally, widely patent IMA: Widely patent Inflow: No aneurysm or dissection. Veins: Grossly patent.  Retroaortic  left renal vein noted. Review of the MIP images confirms the above findings. NON-VASCULAR Hepatobiliary: No focal liver abnormality is seen. Status post cholecystectomy. No biliary dilatation. Pancreas: No focal abnormality or ductal dilatation. Spleen: No focal abnormality.  Normal size. Adrenals/Urinary Tract: No adrenal abnormality. No focal renal abnormality. No stones or hydronephrosis. Urinary bladder is unremarkable. Stomach/Bowel: Normal appendix. Stomach, large  and small bowel grossly unremarkable. Lymphatic: No adenopathy Reproductive: Probable left ovarian cyst measuring 3.7 cm. Uterus grossly unremarkable. No right adnexal mass. Other: No free fluid or free air. Musculoskeletal: No acute bony abnormality. Review of the MIP images confirms the above findings. IMPRESSION: No evidence of aortic aneurysm or dissection. No acute cardiopulmonary disease. No acute findings in the abdomen or pelvis. 3.7 cm left ovarian cyst. Electronically Signed   By: Charlett Nose M.D.   On: 04/07/2018 19:16    EKG:    IMPRESSION AND PLAN:   Caroline Garcia  is a 58 y.o. female with a known history of past medical history is listed below comes to the emergency room with chest pain on and off for several days. She was seen in the emergency room couple days ago and workup was negative at that time. Patient came back again since patient pain return and was radiating to her left arm. Denies any nausea vomiting or shortness of breath. She has no cardiac history. Patient's initial EKG showed some nonspecific changes.  1. Acute non-STEMI -patient came in with recurrent chest pain and elevated troponin of 7.85 -she underwent cardiac cath and showed clean coronaries -per cardiology hold off on dual antiplatelet agents.  -Echo of the heart and CRP pending -chest pain free at present  2. Leukocytosis could be reactive  3.DVT Prophylaxis subcu Lovenox  D/w Dr End All the records are reviewed and case discussed with ED provider.   CODE STATUS: full  TOTAL critical TIME TAKING CARE OF THIS PATIENT: 50 minutes.    Enedina Finner M.D on 04/09/2018 at 6:47 PM  Between 7am to 6pm - Pager - [phone]  After 6pm go to [email or web address] - password [email or web address] [facility]  [facility]  Office  [phone]  CC: Primary care physician; Patient, No Pcp Per

## 2018-04-09 NOTE — ED Notes (Signed)
Pt at this time stating that chest pressure is subsiding at this time. Pt respirations even and non-labored. Cardiologist still at bedside.

## 2018-04-09 NOTE — ED Notes (Signed)
Pt placed on the zoll monitor. MD Siadecki at bedside.

## 2018-04-10 ENCOUNTER — Encounter: Payer: Self-pay | Admitting: Internal Medicine

## 2018-04-10 ENCOUNTER — Other Ambulatory Visit: Payer: Self-pay

## 2018-04-10 ENCOUNTER — Inpatient Hospital Stay (HOSPITAL_COMMUNITY)
Admit: 2018-04-10 | Discharge: 2018-04-10 | Disposition: A | Discharge status: Home / Self Care | Payer: BLUE CROSS/BLUE SHIELD | Attending: Internal Medicine | Admitting: Internal Medicine

## 2018-04-10 DIAGNOSIS — I340 Nonrheumatic mitral (valve) insufficiency: Secondary | ICD-10-CM

## 2018-04-10 DIAGNOSIS — I361 Nonrheumatic tricuspid (valve) insufficiency: Secondary | ICD-10-CM

## 2018-04-10 DIAGNOSIS — I319 Disease of pericardium, unspecified: Principal | ICD-10-CM

## 2018-04-10 DIAGNOSIS — I259 Chronic ischemic heart disease, unspecified: Secondary | ICD-10-CM

## 2018-04-10 LAB — BASIC METABOLIC PANEL
Anion gap: 5 (ref 5–15)
BUN: 15 mg/dL (ref 6–20)
CO2: 22 mmol/L (ref 22–32)
Calcium: 8.2 mg/dL — ABNORMAL LOW (ref 8.9–10.3)
Chloride: 110 mmol/L (ref 98–111)
Creatinine, Ser: 0.88 mg/dL (ref 0.44–1.00)
GFR calc Af Amer: >60 mL/min (ref >60)
GFR calc non Af Amer: >60 mL/min (ref >60)
Glucose, Bld: 95 mg/dL (ref 70–99)
POTASSIUM: 3.7 mmol/L (ref 3.5–5.1)
Sodium: 137 mmol/L (ref 135–145)

## 2018-04-10 LAB — URINE DRUG SCREEN, QUALITATIVE (ARMC ONLY)
AMPHETAMINES, UR SCREEN: NOT DETECTED
Barbiturates, Ur Screen: NOT DETECTED
Benzodiazepine, Ur Scrn: POSITIVE — AB
Cannabinoid 50 Ng, Ur ~~LOC~~: NOT DETECTED
Cocaine Metabolite,Ur ~~LOC~~: NOT DETECTED
MDMA (Ecstasy)Ur Screen: NOT DETECTED
Methadone Scn, Ur: NOT DETECTED
Opiate, Ur Screen: NOT DETECTED
Phencyclidine (PCP) Ur S: NOT DETECTED
Tricyclic, Ur Screen: NOT DETECTED

## 2018-04-10 LAB — CBC
HCT: 33.9 % — ABNORMAL LOW (ref 36.0–46.0)
HEMOGLOBIN: 11.2 g/dL — AB (ref 12.0–15.0)
MCH: 29.3 pg (ref 26.0–34.0)
MCHC: 33 g/dL (ref 30.0–36.0)
MCV: 88.7 fL (ref 80.0–100.0)
Platelets: 282 10*3/uL (ref 150–400)
RBC: 3.82 MIL/uL — ABNORMAL LOW (ref 3.87–5.11)
RDW: 13.8 % (ref 11.5–15.5)
WBC: 12 10*3/uL — AB (ref 4.0–10.5)
nRBC: 0 % (ref 0.0–0.2)

## 2018-04-10 LAB — TROPONIN I
Troponin I: 11.87 ng/mL (ref <0.03)
Troponin I: 9.37 ng/mL (ref <0.03)

## 2018-04-10 LAB — GLUCOSE, CAPILLARY: Glucose-Capillary: 68 mg/dL — ABNORMAL LOW (ref 70–99)

## 2018-04-10 LAB — LIPID PANEL
Cholesterol: 145 mg/dL (ref 0–200)
HDL: 50 mg/dL (ref >40)
LDL Cholesterol: 78 mg/dL (ref 0–99)
TRIGLYCERIDES: 84 mg/dL (ref <150)
Total CHOL/HDL Ratio: 2.9 RATIO
VLDL: 17 mg/dL (ref 0–40)

## 2018-04-10 NOTE — Progress Notes (Signed)
Progress Note  Patient Name: Caroline Garcia Date of Encounter: 04/10/2018  Primary Cardiologist: [facility]  Subjective   Reports that she feels well, did not sleep well last night No significant chest pain today Reviewed recent data with her, cardiac catheterization findings with no significant coronary disease normal LV function Echocardiogram is pending Elevated sedimentation rate and CRP  Inpatient Medications    Scheduled Meds: . aspirin  81 mg Oral Daily  . atorvastatin  40 mg Oral q1800  . colchicine  0.6 mg Oral Daily  . enoxaparin (LOVENOX) injection  40 mg Subcutaneous Q24H  . nitroGLYCERIN  0.5 inch Topical Q6H  . sodium chloride flush  3 mL Intravenous Q12H   Continuous Infusions: . sodium chloride     PRN Meds: sodium chloride, acetaminophen, nitroGLYCERIN, ondansetron (ZOFRAN) IV, sodium chloride flush   Vital Signs    Vitals:   04/10/18 0400 04/10/18 0500 04/10/18 0700 04/10/18 0800  BP: 119/85 105/66 92/63   Pulse: 66 69 (!) 58 77  Resp: 18 20 12 20   Temp: 98.5 F (36.9 C)   98.4 F (36.9 C)  TempSrc: Oral   Oral  SpO2: 99% 96% 97% 99%  Weight:      Height:        Intake/Output Summary (Last 24 hours) at 04/10/2018 1117 Last data filed at 04/10/2018 0950 Gross per 24 hour  Intake 532.85 ml  Output 200 ml  Net 332.85 ml   Last 3 Weights 04/09/2018 04/07/2018 05/05/2017  Weight (lbs) 187 lb 6.3 oz 180 lb 170 lb  Weight (kg) 85 kg 81.647 kg 77.111 kg      Telemetry    Normal sinus rhythm- Personally Reviewed  ECG    - Personally Reviewed  Physical Exam   Constitutional:  oriented to person, place, and time. No distress.  HENT:  Head: Grossly normal Eyes:  no discharge. No scleral icterus.  Neck: No JVD, no carotid bruits  Cardiovascular: Regular rate and rhythm, no murmurs appreciated Pulmonary/Chest: Clear to auscultation bilaterally, no wheezes or rails Abdominal: Soft.  no distension.  no tenderness.  Musculoskeletal: Normal  range of motion Neurological:  normal muscle tone. Coordination normal. No atrophy Skin: Skin warm and dry Psychiatric: normal affect, pleasant   Labs    Chemistry Recent Labs  Lab 04/07/18 1455 04/07/18 1604 04/09/18 1534 04/10/18 0622  NA 139  --  137 137  K 4.6  --  3.5 3.7  CL 106  --  106 110  CO2 27  --  20* 22  GLUCOSE 92  --  93 95  BUN 16  --  16 15  CREATININE 1.15*  --  0.94 0.88  CALCIUM 9.6  --  9.1 8.2*  PROT  --  7.0  --   --   ALBUMIN  --  3.7  --   --   AST  --  20  --   --   ALT  --  16  --   --   ALKPHOS  --  46  --   --   BILITOT  --  0.5  --   --   GFRNONAA 53*  --  >60 >60  GFRAA >60  --  >60 >60  ANIONGAP 6  --  11 5     Hematology Recent Labs  Lab 04/07/18 1455 04/09/18 1534 04/10/18 0622  WBC 13.1* 15.0* 12.0*  RBC 4.73 4.34 3.82*  HGB 13.8 12.8 11.2*  HCT 42.9 40.2 33.9*  MCV 90.7 92.6 88.7  MCH 29.2 29.5 29.3  MCHC 32.2 31.8 33.0  RDW 13.6 13.8 13.8  PLT 342 319 282    Cardiac Enzymes Recent Labs  Lab 04/09/18 1534 04/09/18 1838 04/10/18 0002 04/10/18 0622  TROPONINI 7.85* 15.66* 11.87* 9.37*   No results for input(s): TROPIPOC in the last 168 hours.   BNPNo results for input(s): BNP, PROBNP in the last 168 hours.   DDimer No results for input(s): DDIMER in the last 168 hours.   Radiology    Dg Chest 2 View  Result Date: 04/09/2018 CLINICAL DATA:  Left chest pain EXAM: CHEST - 2 VIEW COMPARISON:  April 07, 2018 FINDINGS: The heart size and mediastinal contours are within normal limits. There is mild atelectasis of left lung base. No focal infiltrate, pulmonary edema, or pleural effusion. The visualized skeletal structures are stable. IMPRESSION: Mild linear opacity of left lung base, favor atelectasis. Electronically Signed   By: Sherian Rein M.D.   On: 04/09/2018 16:04    Cardiac Studies   Echocardiogram pending  Cardiac catheterization performed febrile 12/2018 1. No angiographically significant coronary  artery disease to explain patient's chest pain, EKG changes, and elevated troponin.  Question vasospasm versus myopericarditis. 2. Normal left ventricular systolic function and filling pressure.   Patient Profile     Caroline Garcia is a 58 y.o. female with no significant past medical history who is being seen today for the evaluation of chest pain Cardiac catheterization with no significant disease normal LV gram Elevated sedimentation rate and CRP  Assessment & Plan    Elevated troponin/myopericarditis Peak troponin XI now trending downward 9 Elevated sedimentation rate and CRP concerning for inflammation She denies any recent stressors apart from some GI issues 5 days ago No recent viral symptoms Initial LV gram normal ejection fraction Waiting for echocardiogram to evaluate pericardium, exclude effusion and wall motion abnormality --Would continue colchicine  No room on her blood pressure to add beta-blockers or ACE inhibitor -We will need nitroglycerin at the time of discharge for any further chest pain, unable to definitively exclude spasm.  Suggested to her if she takes nitro sublingual on a regular basis for recurrent chest pain we could try long-acting nitrates. -Suspect she may need to be monitored 1 more day given the significant climb in her troponin   Total encounter time more than 25 minutes  Greater than 50% was spent in counseling and coordination of care with the patient   For questions or updates, please contact [facility] Please consult [email or web address] for contact info under        Signed, Julien Nordmann, MD  04/10/2018, 11:17 AM

## 2018-04-10 NOTE — Progress Notes (Signed)
Report given to Serenity RN for patient to be transferred to room 247. Patient husband is at bedside during transfer.

## 2018-04-10 NOTE — Progress Notes (Signed)
[facility] [place] at [facility]   PATIENT NAME: Caroline Garcia    MR#:  [id number]  DATE OF BIRTH:  Sep 30, 1960  SUBJECTIVE:  CHIEF COMPLAINT:   Chief Complaint  Patient presents with  . Chest Pain   -Status post left heart cath yesterday with no occlusions noted.  Episode of chest pain last evening, none now.  Breathing comfortably  REVIEW OF SYSTEMS:  Review of Systems  Constitutional: Negative for chills, fever and malaise/fatigue.  HENT: Negative for congestion, hearing loss, nosebleeds and tinnitus.   Eyes: Negative for blurred vision and double vision.  Respiratory: Negative for cough, shortness of breath and wheezing.   Cardiovascular: Positive for chest pain. Negative for palpitations.  Gastrointestinal: Negative for abdominal pain, constipation, diarrhea, nausea and vomiting.  Genitourinary: Negative for dysuria.  Musculoskeletal: Negative for myalgias.  Neurological: Negative for dizziness, focal weakness, seizures, weakness and headaches.    DRUG ALLERGIES:  No Known Allergies  VITALS:  Blood pressure 92/63, pulse 77, temperature 98.4 F (36.9 C), temperature source Oral, resp. rate 20, height 5\' 7"  (1.702 m), weight 85 kg, SpO2 99 %.  PHYSICAL EXAMINATION:  Physical Exam   GENERAL:  58 y.o.-year-old patient lying in the bed with no acute distress.  EYES: Pupils equal, round, reactive to light and accommodation. No scleral icterus. Extraocular muscles intact.  HEENT: Head atraumatic, normocephalic. Oropharynx and nasopharynx clear.  NECK:  Supple, no jugular venous distention. No thyroid enlargement, no tenderness.  LUNGS: Normal breath sounds bilaterally, no wheezing, rales,rhonchi or crepitation. No use of accessory muscles of respiration.  CARDIOVASCULAR: S1, S2 normal. No murmurs, rubs, or gallops.  ABDOMEN: Soft, nontender, nondistended. Bowel sounds present. No organomegaly or mass.  EXTREMITIES: Right radial artery dressing in  place.  No pedal edema, cyanosis, or clubbing.  NEUROLOGIC: Cranial nerves II through XII are intact. Muscle strength 5/5 in all extremities. Sensation intact. Gait not checked.  PSYCHIATRIC: The patient is alert and oriented x 3.  SKIN: No obvious rash, lesion, or ulcer.    LABORATORY PANEL:   CBC Recent Labs  Lab 04/10/18 0622  WBC 12.0*  HGB 11.2*  HCT 33.9*  PLT 282   ------------------------------------------------------------------------------------------------------------------  Chemistries  Recent Labs  Lab 04/07/18 1604  04/10/18 0622  NA  --    < > 137  K  --    < > 3.7  CL  --    < > 110  CO2  --    < > 22  GLUCOSE  --    < > 95  BUN  --    < > 15  CREATININE  --    < > 0.88  CALCIUM  --    < > 8.2*  AST 20  --   --   ALT 16  --   --   ALKPHOS 46  --   --   BILITOT 0.5  --   --    < > = values in this interval not displayed.   ------------------------------------------------------------------------------------------------------------------  Cardiac Enzymes Recent Labs  Lab 04/10/18 0622  TROPONINI 9.37*   ------------------------------------------------------------------------------------------------------------------  RADIOLOGY:  Dg Chest 2 View  Result Date: 04/09/2018 CLINICAL DATA:  Left chest pain EXAM: CHEST - 2 VIEW COMPARISON:  April 07, 2018 FINDINGS: The heart size and mediastinal contours are within normal limits. There is mild atelectasis of left lung base. No focal infiltrate, pulmonary edema, or pleural effusion. The visualized skeletal structures are stable. IMPRESSION: Mild linear opacity of left  lung base, favor atelectasis. Electronically Signed   By: Sherian Rein M.D.   On: 04/09/2018 16:04    EKG:   Orders placed or performed during the hospital encounter of 04/09/18  . ED EKG  . ED EKG  . EKG 12-Lead  . EKG 12-Lead  . EKG 12-Lead  . EKG 12-Lead  . EKG 12-Lead  . EKG 12-Lead    ASSESSMENT AND PLAN:   58 year old  female with no significant past medical history presents to hospital secondary to chest pain noted to have elevated troponin and ST changes on EKG.  1.  Acute chest pain-concern for either coronary vasospasm or myopericarditis -No recent viral infection but has been exposed to people with viral infection last week -Status post urgent cardiac catheterization showing no culprit lesions. -Started on colchicine.  Appreciate cardiology consult -Follow-up echo today -Continue to trend troponin.  LDL of 78 -Continue aspirin and statin -PRN nitroglycerin paste for chest pain which is improved now  2.  DVT prophylaxis-Lovenox   Stable. Likely can be transferred to the floor today   All the records are reviewed and case discussed with Care Management/Social Workerr. Management plans discussed with the patient, family and they are in agreement.  CODE STATUS: Full code  TOTAL TIME TAKING CARE OF THIS PATIENT: 33 minutes.   POSSIBLE D/C IN 1-2 DAYS, DEPENDING ON CLINICAL CONDITION.   Enid Baas M.D on 04/10/2018 at 8:25 AM  Between 7am to 6pm - Pager - [phone]  After 6pm go to [email or web address] - password [employer]  [facility] [place] [facility]  Office  [phone]  CC: Primary care physician; Patient, No Pcp Per

## 2018-04-11 ENCOUNTER — Telehealth: Payer: Self-pay | Admitting: Internal Medicine

## 2018-04-11 LAB — ECHOCARDIOGRAM COMPLETE
Height: 67 in
Weight: 2912 oz

## 2018-04-11 LAB — BASIC METABOLIC PANEL
Anion gap: 4 — ABNORMAL LOW (ref 5–15)
BUN: 12 mg/dL (ref 6–20)
CHLORIDE: 110 mmol/L (ref 98–111)
CO2: 25 mmol/L (ref 22–32)
Calcium: 8.4 mg/dL — ABNORMAL LOW (ref 8.9–10.3)
Creatinine, Ser: 0.85 mg/dL (ref 0.44–1.00)
GFR calc Af Amer: >60 mL/min (ref >60)
GFR calc non Af Amer: >60 mL/min (ref >60)
GLUCOSE: 89 mg/dL (ref 70–99)
Potassium: 3.7 mmol/L (ref 3.5–5.1)
Sodium: 139 mmol/L (ref 135–145)

## 2018-04-11 LAB — TROPONIN I: Troponin I: 5.38 ng/mL (ref <0.03)

## 2018-04-11 MED ORDER — COLCHICINE 0.6 MG PO TABS: 0.6000 mg | ORAL_TABLET | Freq: Two times a day (BID) | ORAL | Status: DC

## 2018-04-11 MED ORDER — NITROGLYCERIN 0.4 MG SL SUBL: 0.4000 mg | SUBLINGUAL_TABLET | SUBLINGUAL | 2 refills | Status: DC | PRN

## 2018-04-11 MED ORDER — INFLUENZA VAC SPLIT QUAD 0.5 ML IM SUSY: 0.5000 mL | PREFILLED_SYRINGE | INTRAMUSCULAR | Status: DC

## 2018-04-11 MED ORDER — COLCHICINE 0.6 MG PO TABS: 0.6000 mg | ORAL_TABLET | Freq: Two times a day (BID) | ORAL | 2 refills | Status: DC

## 2018-04-11 MED ORDER — ASPIRIN 81 MG PO CHEW: 81.0000 mg | CHEWABLE_TABLET | Freq: Every day | ORAL | 1 refills | Status: DC

## 2018-04-11 NOTE — Plan of Care (Signed)
Cardiac cath puncture sites without active bleed or hematoma.

## 2018-04-11 NOTE — Telephone Encounter (Signed)
lmov to schedule sooner tcm appt.

## 2018-04-11 NOTE — Discharge Summary (Signed)
[employer] [facility] - [place] at [facility]   PATIENT NAME: Caroline Garcia    MR#:  [id number]  DATE OF BIRTH:  1960-12-05  DATE OF ADMISSION:  04/09/2018   ADMITTING PHYSICIAN: Yvonne Kendall, MD  DATE OF DISCHARGE: 04/11/2018  1:35 PM  PRIMARY CARE PHYSICIAN: Patient, No Pcp Per   ADMISSION DIAGNOSIS:   Myocardial infarction, unspecified MI type, unspecified artery (HCC) [I21.9] Myopericarditis [I31.9]  DISCHARGE DIAGNOSIS:   Principal Problem:   Chest pain Active Problems:   Myopericarditis   SECONDARY DIAGNOSIS:   Past Medical History:  Diagnosis Date  . PONV (postoperative nausea and vomiting)   . UTI (urinary tract infection)    hx    HOSPITAL COURSE:   58 year old female with no significant past medical history presents to hospital secondary to chest pain noted to have elevated troponin and ST changes on EKG.  1.  Acute chest pain-concern for either coronary vasospasm or myopericarditis -No recent viral infection but has been exposed to people with viral infection last week -Status post urgent cardiac catheterization showing no culprit lesions. -Started on colchicine.  Clinically improving.  Appreciate cardiology consult -Echocardiogram without any pericardial effusion.  Normal LV systolic function, EF of 60 to 65%.  No regional wall motion abnormalities noted. -Downtrending troponin.  Slightly decreasing..  LDL of 78 -Continue aspirin and statin -Discontinue Nitropaste given headaches.  Sublingual nitro as needed for vasospasm  Ambulated well without any difficulty.  Will be discharged home today.  DISCHARGE CONDITIONS:   Guarded  CONSULTS OBTAINED:   Cardiology consult by [facility]  DRUG ALLERGIES:   No Known Allergies DISCHARGE MEDICATIONS:   Allergies as of 04/11/2018   No Known Allergies     Medication List    STOP taking these medications   ketorolac 10 MG tablet Commonly known as:  TORADOL   methocarbamol 500 MG  tablet Commonly known as:  ROBAXIN   ondansetron 4 MG tablet Commonly known as:  ZOFRAN   oxyCODONE-acetaminophen 5-325 MG tablet Commonly known as:  ROXICET   valACYclovir 1000 MG tablet Commonly known as:  VALTREX     TAKE these medications   aspirin 81 MG chewable tablet Chew 1 tablet (81 mg total) by mouth daily. Start taking on:  April 12, 2018   colchicine 0.6 MG tablet Take 1 tablet (0.6 mg total) by mouth 2 (two) times daily.   nitroGLYCERIN 0.4 MG SL tablet Commonly known as:  NITROSTAT Place 1 tablet (0.4 mg total) under the tongue every 5 (five) minutes as needed for chest pain.        DISCHARGE INSTRUCTIONS:   1. PCP f/u in 1-2 weeks 2. Cardiology f/u in 2 weeks  DIET:   Regular diet  ACTIVITY:   Activity as tolerated  OXYGEN:   Home Oxygen: No.  Oxygen Delivery: room air  DISCHARGE LOCATION:   home   If you experience worsening of your admission symptoms, develop shortness of breath, life threatening emergency, suicidal or homicidal thoughts you must seek medical attention immediately by calling 911 or calling your MD immediately  if symptoms less severe.  You Must read complete instructions/literature along with all the possible adverse reactions/side effects for all the Medicines you take and that have been prescribed to you. Take any new Medicines after you have completely understood and accpet all the possible adverse reactions/side effects.   Please note  You were cared for by a hospitalist during your hospital stay. If you have any questions about your discharge  medications or the care you received while you were in the hospital after you are discharged, you can call the unit and asked to speak with the hospitalist on call if the hospitalist that took care of you is not available. Once you are discharged, your primary care physician will handle any further medical issues. Please note that NO REFILLS for any discharge medications will be  authorized once you are discharged, as it is imperative that you return to your primary care physician (or establish a relationship with a primary care physician if you do not have one) for your aftercare needs so that they can reassess your need for medications and monitor your lab values.    On the day of Discharge:  VITAL SIGNS:   Blood pressure 102/64, pulse 66, temperature 98.5 F (36.9 C), temperature source Oral, resp. rate 18, height 5\' 7"  (1.702 m), weight 82.1 kg, SpO2 98 %.  PHYSICAL EXAMINATION:    GENERAL:  58 y.o.-year-old patient lying in the bed with no acute distress.  EYES: Pupils equal, round, reactive to light and accommodation. No scleral icterus. Extraocular muscles intact.  HEENT: Head atraumatic, normocephalic. Oropharynx and nasopharynx clear.  NECK:  Supple, no jugular venous distention. No thyroid enlargement, no tenderness.  LUNGS: Normal breath sounds bilaterally, no wheezing, rales,rhonchi or crepitation. No use of accessory muscles of respiration.  No shingles/Vesicles noted on exam CARDIOVASCULAR: S1, S2 normal. No murmurs, rubs, or gallops.  ABDOMEN: Soft, nontender, nondistended. Bowel sounds present. No organomegaly or mass.  EXTREMITIES: Right radial artery dressing in place.  Right groin cath site without any bleeding.  No pedal edema, cyanosis, or clubbing.  NEUROLOGIC: Cranial nerves II through XII are intact. Muscle strength 5/5 in all extremities. Sensation intact. Gait not checked.  PSYCHIATRIC: The patient is alert and oriented x 3.  SKIN: No obvious rash, lesion, or ulcer.   DATA REVIEW:   CBC Recent Labs  Lab 04/10/18 0622  WBC 12.0*  HGB 11.2*  HCT 33.9*  PLT 282    Chemistries  Recent Labs  Lab 04/07/18 1604  04/11/18 0433  NA  --    < > 139  K  --    < > 3.7  CL  --    < > 110  CO2  --    < > 25  GLUCOSE  --    < > 89  BUN  --    < > 12  CREATININE  --    < > 0.85  CALCIUM  --    < > 8.4*  AST 20  --   --   ALT 16  --    --   ALKPHOS 46  --   --   BILITOT 0.5  --   --    < > = values in this interval not displayed.     Microbiology Results  Results for orders placed or performed during the hospital encounter of 04/09/18  MRSA PCR Screening     Status: None   Collection Time: 04/09/18  6:27 PM  Result Value Ref Range Status   MRSA by PCR NEGATIVE NEGATIVE Final    Comment:        The GeneXpert MRSA Assay (FDA approved for NASAL specimens only), is one component of a comprehensive MRSA colonization surveillance program. It is not intended to diagnose MRSA infection nor to guide or monitor treatment for MRSA infections. Performed at [facility], [address], [place], [place] [address]  RADIOLOGY:  No results found.   Management plans discussed with the patient, family and they are in agreement.  CODE STATUS:     Code Status Orders  (From admission, onward)         Start     Ordered   04/09/18 1821  Full code  Continuous     04/09/18 1820        Code Status History    Date Active Date Inactive Code Status Order ID Comments User Context   04/29/2012 1531 04/30/2012 1527 Full Code [id number]  Wende NeighborsVernon, Sheila M, PA Inpatient      TOTAL TIME TAKING CARE OF THIS PATIENT: 39 minutes.    Enid Baasadhika Jolette Lana M.D on 04/11/2018 at 1:58 PM  Between 7am to 6pm - Pager - [phone]  After 6pm go to [email or web address] - Social research officer, governmentpassword [email or web address]  [facility] [place] [facility]  Office  [phone]  CC: Primary care physician; Patient, No Pcp Per   Note: This dictation was prepared with Dragon dictation along with smaller phrase technology. Any transcriptional errors that result from this process are unintentional.

## 2018-04-11 NOTE — Progress Notes (Signed)
[employer] [facility] - [place] at [facility]   PATIENT NAME: Caroline Garcia    MR#:  [id number]  DATE OF BIRTH:  1960-10-04  SUBJECTIVE:  CHIEF COMPLAINT:   Chief Complaint  Patient presents with  . Chest Pain   -Feels much better this morning.  Could not sleep well in the hospital though  REVIEW OF SYSTEMS:  Review of Systems  Constitutional: Negative for chills, fever and malaise/fatigue.  HENT: Negative for congestion, hearing loss, nosebleeds and tinnitus.   Eyes: Negative for blurred vision and double vision.  Respiratory: Negative for cough, shortness of breath and wheezing.   Cardiovascular: Positive for chest pain. Negative for palpitations.  Gastrointestinal: Negative for abdominal pain, constipation, diarrhea, nausea and vomiting.  Genitourinary: Negative for dysuria.  Musculoskeletal: Negative for myalgias.  Neurological: Negative for dizziness, focal weakness, seizures, weakness and headaches.    DRUG ALLERGIES:  No Known Allergies  VITALS:  Blood pressure 102/64, pulse 66, temperature 98.5 F (36.9 C), temperature source Oral, resp. rate 18, height 5\' 7"  (1.702 m), weight 82.1 kg, SpO2 98 %.  PHYSICAL EXAMINATION:  Physical Exam   GENERAL:  58 y.o.-year-old patient lying in the bed with no acute distress.  EYES: Pupils equal, round, reactive to light and accommodation. No scleral icterus. Extraocular muscles intact.  HEENT: Head atraumatic, normocephalic. Oropharynx and nasopharynx clear.  NECK:  Supple, no jugular venous distention. No thyroid enlargement, no tenderness.  LUNGS: Normal breath sounds bilaterally, no wheezing, rales,rhonchi or crepitation. No use of accessory muscles of respiration.  No shingles/Vesicles noted on exam CARDIOVASCULAR: S1, S2 normal. No murmurs, rubs, or gallops.  ABDOMEN: Soft, nontender, nondistended. Bowel sounds present. No organomegaly or mass.  EXTREMITIES: Right radial artery dressing in place.  Right groin  cath site without any bleeding.  No pedal edema, cyanosis, or clubbing.  NEUROLOGIC: Cranial nerves II through XII are intact. Muscle strength 5/5 in all extremities. Sensation intact. Gait not checked.  PSYCHIATRIC: The patient is alert and oriented x 3.  SKIN: No obvious rash, lesion, or ulcer.    LABORATORY PANEL:   CBC Recent Labs  Lab 04/10/18 0622  WBC 12.0*  HGB 11.2*  HCT 33.9*  PLT 282   ------------------------------------------------------------------------------------------------------------------  Chemistries  Recent Labs  Lab 04/07/18 1604  04/11/18 0433  NA  --    < > 139  K  --    < > 3.7  CL  --    < > 110  CO2  --    < > 25  GLUCOSE  --    < > 89  BUN  --    < > 12  CREATININE  --    < > 0.85  CALCIUM  --    < > 8.4*  AST 20  --   --   ALT 16  --   --   ALKPHOS 46  --   --   BILITOT 0.5  --   --    < > = values in this interval not displayed.   ------------------------------------------------------------------------------------------------------------------  Cardiac Enzymes Recent Labs  Lab 04/10/18 0622  TROPONINI 9.37*   ------------------------------------------------------------------------------------------------------------------  RADIOLOGY:  Dg Chest 2 View  Result Date: 04/09/2018 CLINICAL DATA:  Left chest pain EXAM: CHEST - 2 VIEW COMPARISON:  April 07, 2018 FINDINGS: The heart size and mediastinal contours are within normal limits. There is mild atelectasis of left lung base. No focal infiltrate, pulmonary edema, or pleural effusion. The visualized skeletal structures are stable. IMPRESSION:  Mild linear opacity of left lung base, favor atelectasis. Electronically Signed   By: Sherian ReinWei-Chen  Lin M.D.   On: 04/09/2018 16:04    EKG:   Orders placed or performed during the hospital encounter of 04/09/18  . ED EKG  . ED EKG  . EKG 12-Lead  . EKG 12-Lead  . EKG 12-Lead  . EKG 12-Lead  . EKG 12-Lead  . EKG 12-Lead    ASSESSMENT AND  PLAN:   58 year old female with no significant past medical history presents to hospital secondary to chest pain noted to have elevated troponin and ST changes on EKG.  1.  Acute chest pain-concern for either coronary vasospasm or myopericarditis -No recent viral infection but has been exposed to people with viral infection last week -Status post urgent cardiac catheterization showing no culprit lesions. -Started on colchicine.  Clinically improving.  Appreciate cardiology consult -Follow-up echo  -Follow-up troponin.  Slightly decreasing..  LDL of 78 -Continue aspirin and statin -Discontinue Nitropaste given headaches.  Sublingual nitro as needed for vasospasm  2.  DVT prophylaxis-Lovenox   Stable. Encourage ambulation.  If stable, can be discharged today   All the records are reviewed and case discussed with Care Management/Social Workerr. Management plans discussed with the patient, family and they are in agreement.  CODE STATUS: Full code  TOTAL TIME TAKING CARE OF THIS PATIENT: 38 minutes.   POSSIBLE D/C today, DEPENDING ON CLINICAL CONDITION.   Enid Baasadhika Carlo Guevarra M.D on 04/11/2018 at 8:42 AM  Between 7am to 6pm - Pager - [phone]  After 6pm go to [email or web address] - password [email or web address]  [facility] [place] [facility]  Office  [phone]  CC: Primary care physician; Patient, No Pcp Per

## 2018-04-11 NOTE — Progress Notes (Signed)
Progress Note  Patient Name: Caroline Garcia Date of Encounter: 04/11/2018  Primary Cardiologist: [facility]  Subjective   Tired this morning, did not sleep well Wonders when she should go back to work No pain in the right wrist Husband at the bedside Reports that she is ambulated, no chest pain or shortness of breath She is on colchicine with no side effects Echocardiogram today normal ejection fraction no other acute findings  Inpatient Medications    Scheduled Meds: . aspirin  81 mg Oral Daily  . atorvastatin  40 mg Oral q1800  . colchicine  0.6 mg Oral BID  . enoxaparin (LOVENOX) injection  40 mg Subcutaneous Q24H  . [START ON 04/12/2018] Influenza vac split quadrivalent PF  0.5 mL Intramuscular Tomorrow-1000  . sodium chloride flush  3 mL Intravenous Q12H   Continuous Infusions: . sodium chloride     PRN Meds: sodium chloride, acetaminophen, nitroGLYCERIN, ondansetron (ZOFRAN) IV, sodium chloride flush   Vital Signs    Vitals:   04/10/18 1915 04/11/18 0021 04/11/18 0342 04/11/18 0830  BP: 107/62 105/61 106/66 102/64  Pulse: 78 73 64 66  Resp: 16 15 16 18   Temp: 99.4 F (37.4 C) 99 F (37.2 C) 99.1 F (37.3 C) 98.5 F (36.9 C)  TempSrc: Oral Oral Oral Oral  SpO2: 97% 100% 99% 98%  Weight:   82.1 kg   Height:        Intake/Output Summary (Last 24 hours) at 04/11/2018 1358 Last data filed at 04/11/2018 0900 Gross per 24 hour  Intake 600 ml  Output 450 ml  Net 150 ml   Last 3 Weights 04/11/2018 04/10/2018 04/09/2018  Weight (lbs) 180 lb 14.4 oz 182 lb 187 lb 6.3 oz  Weight (kg) 82.056 kg 82.555 kg 85 kg      Telemetry    Normal sinus rhythm- Personally Reviewed  ECG    - Personally Reviewed  Physical Exam   Constitutional:  oriented to person, place, and time. No distress.  HENT:  Head: Normocephalic and atraumatic.  Eyes:  no discharge. No scleral icterus.  Neck: Normal range of motion. Neck supple. No JVD present.  Cardiovascular: Normal  rate, regular rhythm, normal heart sounds and intact distal pulses. Exam reveals no gallop and no friction rub. No edema No murmur heard. Pulmonary/Chest: Effort normal and breath sounds normal. No stridor. No respiratory distress.  no wheezes.  no rales.  no tenderness.  Abdominal: Soft.  no distension.  no tenderness.  Musculoskeletal: Normal range of motion.  no  tenderness or deformity.  Neurological:  normal muscle tone. Coordination normal. No atrophy Skin: Skin is warm and dry. No rash noted. not diaphoretic.  Psychiatric:  normal mood and affect. behavior is normal. Thought content normal.     Labs    Chemistry Recent Labs  Lab 04/07/18 1604 04/09/18 1534 04/10/18 0622 04/11/18 0433  NA  --  137 137 139  K  --  3.5 3.7 3.7  CL  --  106 110 110  CO2  --  20* 22 25  GLUCOSE  --  93 95 89  BUN  --  16 15 12   CREATININE  --  0.94 0.88 0.85  CALCIUM  --  9.1 8.2* 8.4*  PROT 7.0  --   --   --   ALBUMIN 3.7  --   --   --   AST 20  --   --   --   ALT 16  --   --   --  ALKPHOS 46  --   --   --   BILITOT 0.5  --   --   --   GFRNONAA  --  >60 >60 >60  GFRAA  --  >60 >60 >60  ANIONGAP  --  11 5 4*     Hematology Recent Labs  Lab 04/07/18 1455 04/09/18 1534 04/10/18 0622  WBC 13.1* 15.0* 12.0*  RBC 4.73 4.34 3.82*  HGB 13.8 12.8 11.2*  HCT 42.9 40.2 33.9*  MCV 90.7 92.6 88.7  MCH 29.2 29.5 29.3  MCHC 32.2 31.8 33.0  RDW 13.6 13.8 13.8  PLT 342 319 282    Cardiac Enzymes Recent Labs  Lab 04/09/18 1838 04/10/18 0002 04/10/18 0622 04/11/18 0433  TROPONINI 15.66* 11.87* 9.37* 5.38*   No results for input(s): TROPIPOC in the last 168 hours.   BNPNo results for input(s): BNP, PROBNP in the last 168 hours.   DDimer No results for input(s): DDIMER in the last 168 hours.   Radiology    Dg Chest 2 View  Result Date: 04/09/2018 CLINICAL DATA:  Left chest pain EXAM: CHEST - 2 VIEW COMPARISON:  April 07, 2018 FINDINGS: The heart size and mediastinal contours  are within normal limits. There is mild atelectasis of left lung base. No focal infiltrate, pulmonary edema, or pleural effusion. The visualized skeletal structures are stable. IMPRESSION: Mild linear opacity of left lung base, favor atelectasis. Electronically Signed   By: Sherian Rein M.D.   On: 04/09/2018 16:04    Cardiac Studies   Echocardiogram  The left ventricle has normal systolic function with an ejection fraction of 60-65%. The cavity size was normal. Left ventricular diastolic parameters were normal No evidence of left ventricular regional wall motion abnormalities.  2. No evidence of left ventricular regional wall motion abnormalities.  3. The right ventricle has normal systolic function. The cavity was normal. There is no increase in right ventricular wall thickness.  4. The inferior vena cava was dilated in size with >50% respiratory variability.  Cardiac catheterization performed febrile 12/2018 1. No angiographically significant coronary artery disease to explain patient's chest pain, EKG changes, and elevated troponin.  Question vasospasm versus myopericarditis. 2. Normal left ventricular systolic function and filling pressure.   Patient Profile     Caroline Garcia is a 58 y.o. female with no significant past medical history who is being seen today for the evaluation of chest pain Cardiac catheterization with no significant disease normal LV gram Elevated sedimentation rate and CRP  Assessment & Plan    Elevated troponin/myopericarditis Elevated troponin, trending down Cardiac catheterization no significant disease Felt to be inflammatory process, myocarditis/myopericarditis -Started on colchicine Recommend she take this for 2 weeks twice daily then down to once daily with follow-up in clinic Elevated sedimentation rate and CRP concerning for inflammation  denies any recent stressors  No recent viral symptoms  No room on her blood pressure to add beta-blockers  or ACE inhibitor Would give nitro for possible coronary spasm   Total encounter time more than 25 minutes  Greater than 50% was spent in counseling and coordination of care with the patient   For questions or updates, please contact [facility] Please consult [email or web address] for contact info under        Signed, Julien Nordmann, MD  04/11/2018, 1:58 PM

## 2018-04-11 NOTE — Telephone Encounter (Signed)
-----   Message from Antonieta Iba, MD sent at 04/11/2018 12:41 PM EST ----- Regarding: TCM Follow up with Dr. END TCM needed TG

## 2018-04-11 NOTE — Progress Notes (Signed)
IVs and tele removed from patient. Discharge instructions given to patient along with hard copy prescriptions. Verbalized understanding. No acute distress at this time. Husband at bedside and will transport patient home.

## 2018-04-15 ENCOUNTER — Telehealth: Payer: Self-pay | Admitting: Internal Medicine

## 2018-04-15 NOTE — Telephone Encounter (Signed)
Spoke with patient.  She's been feeling short of breath since Saturday night to the present. Denies chest pain at this time. She did take 1 nitro yesterday for chest pain which subsided quickly. She did not sound short of breath over the phone.  She denies any redness, warmth, swelling or pain at the incision sites (groin and wrist). She is not scheduled to come for follow up until Thursday; however, Alycia Rossetti has an opening tomorrow. She is agreeable to appointment tomorrow. Pt verbalized understanding to call 911 or go to the emergency room or call us back, if she develops any new or worsening symptoms.

## 2018-04-15 NOTE — Telephone Encounter (Signed)
Pt c/o Shortness Of Breath: STAT if SOB developed within the last 24 hours or pt is noticeably SOB on the phone  1. Are you currently SOB (can you hear that pt is SOB on the phone)? Yes   2. How long have you been experiencing SOB? Started Saturday night and has continued through to today  3. Are you SOB when sitting or when up moving around? Laying down, sitting down - taking a deep breath hurts  4. Are you currently experiencing any other symptoms? no

## 2018-04-15 NOTE — Progress Notes (Signed)
Cardiology Office Note Date:  04/16/2018  Patient ID:  Caroline, Garcia 05-May-1960, MRN [id number] PCP:  Patient, No Pcp Per  Cardiologist:  Dr. Okey Dupre, MD    Chief Complaint: Hospital follow-up  History of Present Illness: Caroline Garcia is a 58 y.o. female with history of possible myocarditis who presents for hospital follow-up after recent admission to [facility] from 2/11 through 2/13 for possible myocarditis.  Prior to the above hospital admission, she did not have any previously known cardiac history.  She was admitted to the hospital on 2/11 following the development of intermittent chest and left arm pain 3 days prior.  On 04/07/2018 she was seen in the ED and diagnosed with shingles.  She reported having previously tried OTC antacids without improvement in symptoms.  CTA of the chest/abdomen/pelvis was negative at that time.  Due to continued intermittent pain she returned to the ER and was found to have lateral ST segment elevation and a troponin of 7.9.  However, both the chest pain and EKG changes improved spontaneously.  Given her presentation and elevated troponin, the patient underwent emergent cardiac cath which showed no angiographically significant CAD to explain the patient's chest pain, EKG changes, and elevated troponin.  There was question of vasospasm versus myopericarditis.  She had normal LV systolic function and filling pressures.  Echo showed an EF of 60 to 65%, normal LV cavity size, normal LV diastolic function parameters, no regional wall motion abnormalities, normal RV systolic function with normal RV cavity size and normal RV wall thickness.  There were no significant valvular abnormalities.  Troponin ultimately peaked at 15.66.  Labs showed urine drug screen positive for benzodiazepine.  Sed rate 50.  CRP 9.  A1c 5.5.  LDL 78.  WBC 13.1 trending to 12.0.  Hgb 11.2.  Potassium 3.7.  Serum creatinine 0.85.  She denied any stressors apart from some GI issues 5 days prior to  her presentation.  No recent viral symptoms.  She was placed on empiric colchicine.  She contacted our office on 04/15/2018 noting shortness of breath since 04/13/2018 without chest pain.  She did report having taken 1 nitro the day prior for chest pain with improvement in symptoms.  Hospital follow-up was moved up to today.  Patient comes in today noting continued sharp substernal chest pain that is worse when laying flat, with deep inspiration, and with cough and associated shortness of breath.  She reports compliance with colchicine twice daily dosing.  No fevers or chills.  No lower extremity swelling.  She has noted a decreased appetite, though denies any early satiety.  Cough that is nonproductive.  Continues to note significant fatigue.  Reports that she did not sleep much the night prior secondary to discomfort anytime she attempted to lay down.  She does note symptom improvement when sitting up and leaning forward.  No dizziness, presyncope, or syncope.   Past Medical History:  Diagnosis Date  . History of echocardiogram    a.  TTE 03/2018: EF of 60 to 65%, normal LV cavity size, normal LV diastolic function parameters, no regional wall motion abnormalities, normal RV systolic function with normal RV cavity size and normal RV wall thickness.  There were no significant valvular abnormalities  . Myocarditis (HCC)    a.  LHC 03/2018: No significant CAD by angiography, normal LVEF, normal LV filling pressure  . PONV (postoperative nausea and vomiting)   . UTI (urinary tract infection)    hx    Past  Surgical History:  Procedure Laterality Date  . ANTERIOR CERVICAL DECOMP/DISCECTOMY FUSION N/A 04/29/2012   Procedure: C5-6, C6-7 Anterior Cervical Discectomy and Fusion, Allograft, Plate;  Surgeon: Eldred Manges, MD;  Location: [facility] OR;  Service: Orthopedics;  Laterality: N/A;  C5-6, C6-7 Anterior Cervical Discectomy and Fusion, Allograft and Plate  . CHOLECYSTECTOMY    . LEFT HEART CATH AND CORONARY  ANGIOGRAPHY N/A 04/09/2018   Procedure: LEFT HEART CATH AND CORONARY ANGIOGRAPHY;  Surgeon: Yvonne Kendall, MD;  Location: [facility] INVASIVE CV LAB;  Service: Cardiovascular;  Laterality: N/A;    Current Meds  Medication Sig  . aspirin 81 MG chewable tablet Chew 1 tablet (81 mg total) by mouth daily.  . colchicine 0.6 MG tablet Take 1 tablet (0.6 mg total) by mouth 2 (two) times daily.  . nitroGLYCERIN (NITROSTAT) 0.4 MG SL tablet Place 1 tablet (0.4 mg total) under the tongue every 5 (five) minutes as needed for chest pain.    Allergies:   Patient has no known allergies.   Social History:  The patient  reports that she has never smoked. She has never used smokeless tobacco. She reports that she does not drink alcohol or use drugs.   Family History:  The patient's family history includes Diabetes in her maternal grandmother; Heart disease in her maternal grandmother; Heart failure in her father; Hyperlipidemia in her father and maternal grandmother; Hypertension in her father and maternal grandmother.  ROS:   Review of Systems  Constitutional: Positive for malaise/fatigue. Negative for chills, diaphoresis, fever and weight loss.  HENT: Negative for congestion.   Eyes: Negative for discharge and redness.  Respiratory: Positive for shortness of breath. Negative for cough, hemoptysis, sputum production and wheezing.   Cardiovascular: Positive for chest pain. Negative for palpitations, orthopnea, claudication, leg swelling and PND.  Gastrointestinal: Negative for abdominal pain, blood in stool, heartburn, melena, nausea and vomiting.  Genitourinary: Negative for hematuria.  Musculoskeletal: Negative for falls and myalgias.  Skin: Negative for rash.  Neurological: Positive for weakness. Negative for dizziness, tingling, tremors, sensory change, speech change, focal weakness and loss of consciousness.  Endo/Heme/Allergies: Does not bruise/bleed easily.  Psychiatric/Behavioral: Negative for  substance abuse. The patient is not nervous/anxious.   All other systems reviewed and are negative.    PHYSICAL EXAM:  VS:  BP 112/68 (BP Location: Left Arm, Patient Position: Sitting, Cuff Size: Normal)   Pulse 63   Ht 5' 7.5" (1.715 m)   Wt 183 lb 8 oz (83.2 kg)   SpO2 98%   BMI 28.32 kg/m  BMI: Body mass index is 28.32 kg/m.  Physical Exam  Constitutional: She is oriented to person, place, and time. She appears well-developed and well-nourished.  HENT:  Head: Normocephalic and atraumatic.  Eyes: Right eye exhibits no discharge. Left eye exhibits no discharge.  Neck: Normal range of motion. No JVD present.  Cardiovascular: Normal rate, regular rhythm, S1 normal, S2 normal and normal heart sounds. Exam reveals no distant heart sounds, no friction rub, no midsystolic click and no opening snap.  No murmur heard. Pulses:      Posterior tibial pulses are 2+ on the right side and 2+ on the left side.  Pulmonary/Chest: Effort normal and breath sounds normal. No respiratory distress. She has no decreased breath sounds. She has no wheezes. She has no rales. She exhibits no tenderness.  Abdominal: Soft. She exhibits no distension. There is no abdominal tenderness.  Musculoskeletal:        General: No edema.  Neurological:  She is alert and oriented to person, place, and time.  Skin: Skin is warm and dry. No cyanosis. Nails show no clubbing.  Psychiatric: She has a normal mood and affect. Her speech is normal and behavior is normal. Judgment and thought content normal.     EKG:  Was ordered and interpreted by me today. Shows NSR, 63 bpm, normal axis, nonspecific ST-T changes  Recent Labs: 04/07/2018: ALT 16 04/10/2018: Hemoglobin 11.2; Platelets 282 04/11/2018: BUN 12; Creatinine, Ser 0.85; Potassium 3.7; Sodium 139  04/10/2018: Cholesterol 145; HDL 50; LDL Cholesterol 78; Total CHOL/HDL Ratio 2.9; Triglycerides 84; VLDL 17   Estimated Creatinine Clearance: 82.8 mL/min (by C-G formula  based on SCr of 0.85 mg/dL).   Wt Readings from Last 3 Encounters:  04/16/18 183 lb 8 oz (83.2 kg)  04/11/18 180 lb 14.4 oz (82.1 kg)  04/07/18 180 lb (81.6 kg)     Other studies reviewed: Additional studies/records reviewed today include: summarized above  ASSESSMENT AND PLAN:  1. Presumed myocarditis versus perimyocarditis versus coronary vasospasm: Notes improvement in symptoms when sitting up today on the exam table.  No rub is heard on exam.  Add ibuprofen 600 mg every 6 hours.  Continue colchicine 0.6 mg twice daily.  Add Imdur 15 mg daily.  Over the next several weeks plan to taper off ibuprofen and continue her on colchicine.  Check stat chest x-ray and limited echo to evaluate for pericardial effusion.  Trend CRP and sed rate.  Check CBC and BMP.  She will need periodic follow-ups and trending of inflammatory markers.  2. Dyspnea: Likely in the setting of the above.  She does not appear to need diuresis at this time.   Discussed with Dr. Mariah Milling.  Disposition: F/u with Dr. Okey Dupre or an APP in 1 week.  Current medicines are reviewed at length with the patient today.  The patient did not have any concerns regarding medicines.  Signed, Eula Listen, PA-C 04/16/2018 3:36 PM     [facility] [place] [address] [place], [place] [address] [phone]

## 2018-04-16 ENCOUNTER — Ambulatory Visit (INDEPENDENT_AMBULATORY_CARE_PROVIDER_SITE_OTHER): Payer: BLUE CROSS/BLUE SHIELD | Admitting: Physician Assistant

## 2018-04-16 ENCOUNTER — Other Ambulatory Visit
Admission: RE | Admit: 2018-04-16 | Discharge: 2018-04-16 | Disposition: A | Discharge status: Home / Self Care | Payer: BLUE CROSS/BLUE SHIELD | Source: Ambulatory Visit | Attending: Physician Assistant | Admitting: Physician Assistant

## 2018-04-16 ENCOUNTER — Encounter: Payer: Self-pay | Admitting: Physician Assistant

## 2018-04-16 ENCOUNTER — Ambulatory Visit
Admission: RE | Admit: 2018-04-16 | Discharge: 2018-04-16 | Disposition: A | Discharge status: Home / Self Care | Payer: BLUE CROSS/BLUE SHIELD | Source: Ambulatory Visit | Attending: Physician Assistant | Admitting: Physician Assistant

## 2018-04-16 VITALS — BP 112/68 | HR 63 | Ht 67.5 in | Wt 183.5 lb

## 2018-04-16 DIAGNOSIS — I319 Disease of pericardium, unspecified: Secondary | ICD-10-CM | POA: Diagnosis not present

## 2018-04-16 DIAGNOSIS — I209 Angina pectoris, unspecified: Secondary | ICD-10-CM

## 2018-04-16 DIAGNOSIS — R0600 Dyspnea, unspecified: Secondary | ICD-10-CM | POA: Diagnosis not present

## 2018-04-16 DIAGNOSIS — J90 Pleural effusion, not elsewhere classified: Secondary | ICD-10-CM | POA: Diagnosis not present

## 2018-04-16 LAB — C-REACTIVE PROTEIN: CRP: 2.5 mg/dL — ABNORMAL HIGH (ref <1.0)

## 2018-04-16 LAB — BASIC METABOLIC PANEL
Anion gap: 7 (ref 5–15)
BUN: 9 mg/dL (ref 6–20)
CALCIUM: 8.8 mg/dL — AB (ref 8.9–10.3)
CO2: 26 mmol/L (ref 22–32)
CREATININE: 0.88 mg/dL (ref 0.44–1.00)
Chloride: 106 mmol/L (ref 98–111)
GFR calc Af Amer: >60 mL/min (ref >60)
GFR calc non Af Amer: >60 mL/min (ref >60)
Glucose, Bld: 96 mg/dL (ref 70–99)
Potassium: 4 mmol/L (ref 3.5–5.1)
Sodium: 139 mmol/L (ref 135–145)

## 2018-04-16 LAB — CBC
HCT: 37.4 % (ref 36.0–46.0)
Hemoglobin: 12 g/dL (ref 12.0–15.0)
MCH: 29.1 pg (ref 26.0–34.0)
MCHC: 32.1 g/dL (ref 30.0–36.0)
MCV: 90.6 fL (ref 80.0–100.0)
Platelets: 386 10*3/uL (ref 150–400)
RBC: 4.13 MIL/uL (ref 3.87–5.11)
RDW: 13.5 % (ref 11.5–15.5)
WBC: 9.3 10*3/uL (ref 4.0–10.5)
nRBC: 0 % (ref 0.0–0.2)

## 2018-04-16 LAB — SEDIMENTATION RATE: Sed Rate: 61 mm/hr — ABNORMAL HIGH (ref 0–30)

## 2018-04-16 MED ORDER — IBUPROFEN 600 MG PO TABS: 600.0000 mg | ORAL_TABLET | Freq: Four times a day (QID) | ORAL | 6 refills | Status: DC | PRN

## 2018-04-16 NOTE — Patient Instructions (Signed)
Medication Instructions:  Your physician has recommended you make the following change in your medication:  1- Motrin Take 1 tablet (600 mg total) by mouth every 6 (six) hours as needed for moderate pain If you need a refill on your cardiac medications before your next appointment, please call your pharmacy.   Lab work: Your physician recommends that you return for lab work today at the medical mall. ( CRP, sed rate, CBC, BMET)  If you have labs (blood work) drawn today and your tests are completely normal, you will receive your results only by: Marland Kitchen MyChart Message (if you have MyChart) OR . A paper copy in the mail If you have any lab test that is abnormal or we need to change your treatment, we will call you to review the results.  Testing/Procedures: 1- Echo  Please return to [facility] on ______________ at _______________ AM/PM for an Echocardiogram. Your physician has requested that you have an echocardiogram. Echocardiography is a painless test that uses sound waves to create images of your heart. It provides your doctor with information about the size and shape of your heart and how well your heart's chambers and valves are working. This procedure takes approximately one hour. There are no restrictions for this procedure. Please note; depending on visual quality an IV may need to be placed.   2- Chest Xray today at the medical mall.    Follow-Up: At [facility], you and your health needs are our priority.  As part of our continuing mission to provide you with exceptional heart care, we have created designated Provider Care Teams.  These Care Teams include your primary Cardiologist (physician) and Advanced Practice Providers (APPs -  Physician Assistants and Nurse Practitioners) who all work together to provide you with the care you need, when you need it. You will need a follow up appointment in 1 weeks. You may see Dr. Okey Dupre or Eula Listen, PA-C

## 2018-04-17 ENCOUNTER — Telehealth: Payer: Self-pay | Admitting: *Deleted

## 2018-04-17 MED ORDER — FUROSEMIDE 20 MG PO TABS: 20.0000 mg | ORAL_TABLET | Freq: Every day | ORAL | 0 refills | Status: DC

## 2018-04-17 NOTE — Telephone Encounter (Signed)
Results of lab work and CXR called to pt. Pt verbalized understanding. She verbalized understanding to take furosemide 20 mg once a day for 2 days.  Rx sent to pharmacy.

## 2018-04-17 NOTE — Telephone Encounter (Signed)
-----   Message from Sondra Barges, PA-C sent at 04/17/2018  9:01 AM EST ----- Regarding: RE: fu advise for scheduling Yes, that will be ok. CXR yesterday after her visit showed a normal sized cardiac silhouette.  ----- Message ----- From: Stann Mainland, RN Sent: 04/17/2018   8:42 AM EST To: Sondra Barges, PA-C, [facility] Triage Subject: FW: fu advise for scheduling                   Alycia Rossetti,  Will this be ok?  ----- Message ----- From: Joline Maxcy Sent: 04/17/2018   8:20 AM EST To: [facility] Triage, [facility] Scheduling Subject: fu advise for scheduling                       Hey ladies,   This patient needed echo and fu in 1 week per Alycia Rossetti 2/18 but the next available is :  3/6 Echo and 3/10 fu   Will add to the waitlist   Thanks   Candise Bowens

## 2018-04-17 NOTE — Telephone Encounter (Signed)
-----   Message from Sondra Barges, PA-C sent at 04/16/2018  4:48 PM EST ----- Renal function normal.  Potassium at goal.  CBC normal.  Please have her take low dose Lasix 20 mg daily for 2 days days given effusion noted on CXR.  Await sed rate and CRP.

## 2018-04-17 NOTE — Telephone Encounter (Signed)
Result Notes for C-reactive protein   Notes recorded by Sondra Barges, PA-C on 04/17/2018 at 8:41 AM EST Inflammation tests remain elevated with one improving.  No changes.      Result Notes for DG Chest 2 View   Notes recorded by Sondra Barges, PA-C on 04/16/2018 at 4:45 PM EST CXR showed small pleural effusion and similar opacity of the left base when compared to prior CXR. Given CBC showed no leukocytosis, this likely does not favor infiltrate. Heart size is normal which is reassuring. Recommend continued close follow up. Await remaining labs.

## 2018-04-18 ENCOUNTER — Ambulatory Visit: Payer: BLUE CROSS/BLUE SHIELD | Admitting: Nurse Practitioner

## 2018-04-26 ENCOUNTER — Ambulatory Visit: Payer: BLUE CROSS/BLUE SHIELD | Admitting: Physician Assistant

## 2018-05-03 ENCOUNTER — Ambulatory Visit (INDEPENDENT_AMBULATORY_CARE_PROVIDER_SITE_OTHER): Payer: BLUE CROSS/BLUE SHIELD

## 2018-05-03 ENCOUNTER — Other Ambulatory Visit: Payer: Self-pay | Admitting: Physician Assistant

## 2018-05-03 DIAGNOSIS — I209 Angina pectoris, unspecified: Secondary | ICD-10-CM

## 2018-05-03 DIAGNOSIS — I313 Pericardial effusion (noninflammatory): Secondary | ICD-10-CM

## 2018-05-03 DIAGNOSIS — I3139 Other pericardial effusion (noninflammatory): Secondary | ICD-10-CM

## 2018-05-03 NOTE — Progress Notes (Signed)
Office Visit    Patient Name: Caroline Garcia Date of Encounter: 05/07/2018  Primary Care Provider:  Patient, No Pcp Per Primary Cardiologist:  Yvonne Kendall, MD  Chief Complaint    58 year old female with a history of possible myocarditis status post hospital admission in February 2020, who presents for follow-up related to chest pain.  Past Medical History    Past Medical History:  Diagnosis Date  . History of echocardiogram    a.  TTE 03/2018: EF of 60 to 65%, normal LV cavity size, normal LV diastolic function parameters, no regional wall motion abnormalities, normal RV systolic function with normal RV cavity size and normal RV wall thickness.  There were no significant valvular abnormalities; b. 04/2018 Echo: EF 60-65%, nl RV fxn. No pericardial effusion.  . Myocarditis (HCC)    a.  LHC 03/2018: No significant CAD by angiography, normal LVEF, normal LV filling pressure  . PONV (postoperative nausea and vomiting)   . UTI (urinary tract infection)    hx   Past Surgical History:  Procedure Laterality Date  . ANTERIOR CERVICAL DECOMP/DISCECTOMY FUSION N/A 04/29/2012   Procedure: C5-6, C6-7 Anterior Cervical Discectomy and Fusion, Allograft, Plate;  Surgeon: Eldred Manges, MD;  Location: [facility] OR;  Service: Orthopedics;  Laterality: N/A;  C5-6, C6-7 Anterior Cervical Discectomy and Fusion, Allograft and Plate  . CHOLECYSTECTOMY    . LEFT HEART CATH AND CORONARY ANGIOGRAPHY N/A 04/09/2018   Procedure: LEFT HEART CATH AND CORONARY ANGIOGRAPHY;  Surgeon: Yvonne Kendall, MD;  Location: [facility] INVASIVE CV LAB;  Service: Cardiovascular;  Laterality: N/A;    Allergies  No Known Allergies  History of Present Illness    58 year old female with the above past medical history, who was seen in the emergency department on February 9 in the setting of chest and left arm pain and was initially diagnosed with shingles.  Symptoms persisted and she presented back to emergency department on  February 11 and was found to have lateral ST segment elevation with a troponin of 7.9.  Chest pain and EKG changes subsequently improved spontaneously.  She was admitted and underwent diagnostic catheterization, which showed no significant CAD.  Echocardiogram showed normal LV function.  Troponin eventually peaked at 15.66.  There was concern for myocarditis versus coronary vasospasm.  She was discharged home on colchicine therapy.  When she was seen in follow-up on February 18, she reported ongoing chest discomfort that was worse with lying flat and deep breathing.  She was placed on ibuprofen 600 mg every 6 hours with a plan to continue colchicine.  Follow-up echo on March 6 showed normal LV function without evidence of pericardial effusion.  Since her last visit, she has had resolution of pleuritic chest pain.  She does note mild chest tightness when she first lays down for bed at night but this resolves within a minute or so.  She does not experience any chest pain or dyspnea throughout the day.  She only took ibuprofen for a week or so and is now just taking colchicine 0.6 mg twice daily.  She does note mild nausea after a dose of colchicine and would like to drop it down to once a day if possible.  She denies palpitations, PND, orthopnea, dizziness, syncope, edema, or early satiety.  Home Medications    Prior to Admission medications   Medication Sig Start Date End Date Taking? Authorizing Provider  aspirin 81 MG chewable tablet Chew 1 tablet (81 mg total) by mouth daily. 04/12/18  Enid Baas, MD  colchicine 0.6 MG tablet Take 1 tablet (0.6 mg total) by mouth 2 (two) times daily. 04/11/18   Enid Baas, MD  furosemide (LASIX) 20 MG tablet Take 1 tablet (20 mg total) by mouth daily. For 2 days. 04/17/18 07/16/18  Sondra Barges, PA-C  ibuprofen (ADVIL,MOTRIN) 600 MG tablet Take 1 tablet (600 mg total) by mouth every 6 (six) hours as needed for moderate pain. 04/16/18   Dunn, Raymon Mutton, PA-C    nitroGLYCERIN (NITROSTAT) 0.4 MG SL tablet Place 1 tablet (0.4 mg total) under the tongue every 5 (five) minutes as needed for chest pain. 04/11/18   Enid Baas, MD    Review of Systems    Mild chest tightness when she first lies down for bed at night which improves within a minute or so.  She does not have take several nitroglycerin.  She denies palpitations, dyspnea, pnd, orthopnea, n, v, dizziness, syncope, edema, weight gain, or early satiety. All other systems reviewed and are otherwise negative except as noted above.  Physical Exam    VS:  BP 112/76 (BP Location: Left Arm, Patient Position: Sitting, Cuff Size: Normal)   Pulse 67   Ht 5\' 7"  (1.702 m)   Wt 178 lb 8 oz (81 kg)   BMI 27.96 kg/m  , BMI Body mass index is 27.96 kg/m. GEN: Well nourished, well developed, in no acute distress. HEENT: normal. Neck: Supple, no JVD, carotid bruits, or masses. Cardiac: RRR, no murmurs, rubs, or gallops. No clubbing, cyanosis, edema.  Radials/PT 2+ and equal bilaterally.  Respiratory:  Respirations regular and unlabored, clear to auscultation bilaterally. GI: Soft, nontender, nondistended, BS + x 4. MS: no deformity or atrophy. Skin: warm and dry, no rash. Neuro:  Strength and sensation are intact. Psych: Normal affect.  Accessory Clinical Findings    ECG personally reviewed by me today -sinus rhythm, 67, inferior and anterolateral T wave inversion- no acute changes.  04/16/18 CRP 2.5 Lab Results  Component Value Date   WBC 9.3 04/16/2018   HGB 12.0 04/16/2018   HCT 37.4 04/16/2018   MCV 90.6 04/16/2018   PLT 386 04/16/2018   Lab Results  Component Value Date   CREATININE 0.88 04/16/2018   BUN 9 04/16/2018   NA 139 04/16/2018   K 4.0 04/16/2018   CL 106 04/16/2018   CO2 26 04/16/2018     Assessment & Plan    1.  Myocarditis: Patient admitted in early February with chest pain and market troponin elevation.  Catheterization showed no significant disease and EF was  normal by echo.  She was treated for myocarditis with colchicine therapy and upon follow-up in mid February, she continued to report positional and pleuritic chest discomfort.  Ibuprofen was added to her regimen with improvement in pleuritic chest discomfort.  A follow-up echo on March 6 showed normal LV function without pericardial effusion.  She has since had significant improvement in pleuritic discomfort, now occurring just for a brief period when she first lays down for bed at night and resolving spontaneously.  She has had some nausea with colchicine.  I did advise that she continue colchicine and did offer PRN antiemetic therapy.  She deferred.  We also discussed that she can try and take colchicine once a day to see if symptoms remain stable but that if she were to have any worsening, she would need to go back to twice daily.  She was agreeable with that plan.  2.  Disposition: Continue  colchicine therapy as above.  Plan for follow-up in 2 months or sooner if necessary.  Wilmon Arms am acting as a Neurosurgeon for Nicolasa Ducking, NP.  I have reviewed the above documentation for accuracy and completeness, and I agree with the above.   Nicolasa Ducking, NP 05/07/2018, 12:03 PM

## 2018-05-06 ENCOUNTER — Telehealth: Payer: Self-pay | Admitting: *Deleted

## 2018-05-06 NOTE — Telephone Encounter (Signed)
-----   Message from Sondra Barges, PA-C sent at 05/03/2018  5:00 PM EST ----- Echo showed pump function normal without evidence of pericardial effusion. Mildly leaky mitral valve. Keep follow up next week.

## 2018-05-06 NOTE — Telephone Encounter (Signed)
Results called to pt. Pt verbalized understanding.  

## 2018-05-06 NOTE — Telephone Encounter (Signed)
No answer. Left message to call back.   

## 2018-05-07 ENCOUNTER — Encounter: Payer: Self-pay | Admitting: Nurse Practitioner

## 2018-05-07 ENCOUNTER — Ambulatory Visit (INDEPENDENT_AMBULATORY_CARE_PROVIDER_SITE_OTHER): Payer: BLUE CROSS/BLUE SHIELD | Admitting: Nurse Practitioner

## 2018-05-07 VITALS — BP 112/76 | HR 67 | Ht 67.0 in | Wt 178.5 lb

## 2018-05-07 DIAGNOSIS — I409 Acute myocarditis, unspecified: Secondary | ICD-10-CM

## 2018-05-07 NOTE — Patient Instructions (Signed)
Medication Instructions:  Your physician recommends that you continue on your current medications as directed. Please refer to the Current Medication list given to you today.  If you need a refill on your cardiac medications before your next appointment, please call your pharmacy.   Lab work None ordered  If you have labs (blood work) drawn today and your tests are completely normal, you will receive your results only by: Marland Kitchen MyChart Message (if you have MyChart) OR . A paper copy in the mail If you have any lab test that is abnormal or we need to change your treatment, we will call you to review the results.  Testing/Procedures: None ordered   Follow-Up: At [facility], you and your health needs are our priority.  As part of our continuing mission to provide you with exceptional heart care, we have created designated Provider Care Teams.  These Care Teams include your primary Cardiologist (physician) and Advanced Practice Providers (APPs -  Physician Assistants and Nurse Practitioners) who all work together to provide you with the care you need, when you need it. You will need a follow up appointment in 2-3 months. Please see Yvonne Kendall, MD.

## 2018-05-13 ENCOUNTER — Telehealth: Payer: Self-pay | Admitting: Internal Medicine

## 2018-05-13 MED ORDER — ISOSORBIDE MONONITRATE ER 30 MG PO TB24: 15.0000 mg | ORAL_TABLET | Freq: Every day | ORAL | 11 refills | Status: DC

## 2018-05-13 NOTE — Telephone Encounter (Signed)
Pt c/o of Chest Pain: STAT if CP now or developed within 24 hours  1. Are you having CP right now? No but has tightness when laying down and sharp pains intermittent during the day   2. Are you experiencing any other symptoms (ex. SOB, nausea, vomiting, sweating)? Sob sweating   3. How long have you been experiencing CP?  Saturday   4. Is your CP continuous or coming and going? Comes and goes   5. Have you taken Nitroglycerin?  Yes Saturday and Sunday helped with pain  ?

## 2018-05-13 NOTE — Telephone Encounter (Signed)
Recent echo was reassuring.  She was considering cutting back colchicine to 0.6mg  daily b/c she was experiencing some nausea.  With recurrent Ss, she should cont BID dosing.  As ntg seemed to help and there was some question as to the role of coronary vasospasm, please add imdur 15mg  daily.

## 2018-05-13 NOTE — Telephone Encounter (Signed)
Patient called and made aware of continuing the colchicine bid and starting Imdur 15 mg daily (half a tablet). She has agreed and will call back if anything is needed.

## 2018-05-13 NOTE — Telephone Encounter (Signed)
Returned the call to the patient. She stated that over the weekend she has been having more frequent sharp bursts of pain. This seems to get worse as the day goes on. As on right now, today, she has not had any pain.  She still has the shortness of breath when she lies down but that tends to get better throughout the night.   She took one nitroglycerin on Saturday and one on Sunday that helped with the pain. She is currently taking the colchicine 0.6mg  bid but is not taking the ibuprofen anymore.  She has been advised that if the pain gets worse and is not relieved that she may to go to the ED for evaluation.

## 2018-05-16 ENCOUNTER — Telehealth: Payer: Self-pay | Admitting: Internal Medicine

## 2018-05-16 ENCOUNTER — Ambulatory Visit (INDEPENDENT_AMBULATORY_CARE_PROVIDER_SITE_OTHER): Payer: BLUE CROSS/BLUE SHIELD | Admitting: Cardiovascular Disease

## 2018-05-16 ENCOUNTER — Other Ambulatory Visit: Payer: Self-pay

## 2018-05-16 ENCOUNTER — Encounter: Payer: Self-pay | Admitting: Cardiovascular Disease

## 2018-05-16 VITALS — BP 140/80 | HR 62 | Ht 68.0 in | Wt 180.0 lb

## 2018-05-16 DIAGNOSIS — R079 Chest pain, unspecified: Secondary | ICD-10-CM

## 2018-05-16 DIAGNOSIS — I409 Acute myocarditis, unspecified: Secondary | ICD-10-CM | POA: Insufficient documentation

## 2018-05-16 MED ORDER — COLCHICINE 0.6 MG PO TABS: 0.6000 mg | ORAL_TABLET | Freq: Two times a day (BID) | ORAL | 6 refills | Status: DC

## 2018-05-16 MED ORDER — OMEPRAZOLE 20 MG PO CPDR: 20.0000 mg | DELAYED_RELEASE_CAPSULE | Freq: Every day | ORAL | 6 refills | Status: DC

## 2018-05-16 MED ORDER — IBUPROFEN 600 MG PO TABS: 600.0000 mg | ORAL_TABLET | Freq: Three times a day (TID) | ORAL | 6 refills | Status: DC

## 2018-05-16 NOTE — Telephone Encounter (Signed)
The patient called in this morning. She stated that her chest pain is getting worse and that the Imdur 15 mg daily has not helped. She started this on Tuesday of this week. She stated that the nitroglycerin had helped prior but it lasted only 5-10 minutes. She has not taken the nitro since starting the Imdur. As of today, she has only had 2 doses of the imudr (which she stated has been giving her a headache as well)  She stated that she was up all night with pain in her chest that radiated to her neck on the left side and down to her left arm. She was also experiencing numbness on the left arm.  She has been advised that she may need to go to the ED since the pain has not been relieved but she is reluctant with the virus going around. Message has been routed to a provider for further recommendation.

## 2018-05-16 NOTE — Telephone Encounter (Signed)
Pt c/o of Chest Pain: STAT if CP now or developed within 24 hours  1. Are you having CP right now? Yes, been up all night with pain - left side of neck and shoulder   2. Are you experiencing any other symptoms (ex. SOB, nausea, vomiting, sweating)? Headache, SOB badly   3. How long have you been experiencing CP? Started yesterday afternoon  4. Is your CP continuous or coming and going? Continuous   5. Have you taken Nitroglycerin? Yes, and states isosorbide hasn't helped any with pain

## 2018-05-16 NOTE — Telephone Encounter (Signed)
Dr. Mariah Milling has agreed to see the patient today. She has been added to his scheduled and notified. She was agreeable and will head to the office now.

## 2018-05-16 NOTE — Progress Notes (Signed)
Cardiology Office Note  Date:  05/16/2018   ID:  Caroline Garcia, DOB: 1960/05/02, MRN: [id number]  PCP:  Patient, No Pcp Per   Chief Complaint  Patient presents with  . other    Patient c/o ACTIVE chest pain, SOB and tingling/numbness own the left arm. Meds reviewed verbally with patient.     HPI:  Caroline Garcia is a 58 y.o. female with a PMHx of: Myopericarditis Chest pain Who presents to the office today for chest pain.   INTERVAL HISTORY: The patient reports today for a follow-up.   Recent hospitalization February 2020 myopericarditis She had elevated troponin, underwent cardiac catheterization showing no significant coronary disease NSAIDs with colchicine was recommended On follow-up visit May 07, 2018 symptoms had resolved, NSAIDs held" continued on colchicine Symptoms have since recurred, having severe chest discomfort   sharp chest pain for 4 days.  Reports that she tried Imdur but this caused headache and did not help her chest pain  Previously took sublingual nitroglycerin with very short relief that did not sustain more than several minutes sharp pain in her left arm. Strong positional nature of her pain  states that she cannot lay down, which may be due to inflammation. Whenever she gets up and starts walking, it starts irritating the chest pain.   Today's Blood pressure 140/80 Total Chol 145/ LDL 78 HBA1C 5.5 CR 0.88 Glucose 96  EKG personally reviewed by myself on todays visit Shows normal sinus rhythm. 62 bpm. T wave abnormality, diffuse.   OTHER PAST MEDICAL HISTORY REVIEWED BY ME FOR TODAY'S VISIT: The most recent echo was on 05/03/2018 showed an ejection fraction of 60 - 65% and there was no evidence of pericardial effusion.  She had an echocardiogram on 04/10/2018 showed an ejection fraction of 60 - 65% and the inferior vena cava was dilated in size with >50% respiratory variability.  She had a cardiac catheterization on 04/09/2018 showed no  angiographically significant coronary artery disease to explain patient's chest pain, EKG changes, and elevated troponin. There was also normal left ventricular systolic function and filling pressure.   PMH:   has a past medical history of History of echocardiogram, Myocarditis (HCC), PONV (postoperative nausea and vomiting), and UTI (urinary tract infection).  PSH:    Past Surgical History:  Procedure Laterality Date  . ANTERIOR CERVICAL DECOMP/DISCECTOMY FUSION N/A 04/29/2012   Procedure: C5-6, C6-7 Anterior Cervical Discectomy and Fusion, Allograft, Plate;  Surgeon: Eldred Manges, MD;  Location: [facility] OR;  Service: Orthopedics;  Laterality: N/A;  C5-6, C6-7 Anterior Cervical Discectomy and Fusion, Allograft and Plate  . CHOLECYSTECTOMY    . LEFT HEART CATH AND CORONARY ANGIOGRAPHY N/A 04/09/2018   Procedure: LEFT HEART CATH AND CORONARY ANGIOGRAPHY;  Surgeon: Yvonne Kendall, MD;  Location: [facility] INVASIVE CV LAB;  Service: Cardiovascular;  Laterality: N/A;    Current Outpatient Medications  Medication Sig Dispense Refill  . colchicine 0.6 MG tablet Take 1 tablet (0.6 mg total) by mouth 2 (two) times daily. 60 tablet 6  . ibuprofen (ADVIL,MOTRIN) 600 MG tablet Take 1 tablet (600 mg total) by mouth 3 (three) times daily. 30 tablet 6  . nitroGLYCERIN (NITROSTAT) 0.4 MG SL tablet Place 1 tablet (0.4 mg total) under the tongue every 5 (five) minutes as needed for chest pain. 30 tablet 2  . omeprazole (PRILOSEC) 20 MG capsule Take 1 capsule (20 mg total) by mouth daily. 30 capsule 6   No current facility-administered medications for this visit.  ALLERGIES:   Patient has no known allergies.   SOCIAL HISTORY:  The patient  reports that she has never smoked. She has never used smokeless tobacco. She reports that she does not drink alcohol or use drugs.   FAMILY HISTORY:   family history includes Diabetes in her maternal grandmother; Heart disease in her maternal grandmother; Heart failure in her  father; Hyperlipidemia in her father and maternal grandmother; Hypertension in her father and maternal grandmother.    REVIEW OF SYSTEMS: Review of Systems  Constitutional: Negative.   Eyes: Negative.   Respiratory: Negative.   Cardiovascular: Positive for chest pain (radiates to left arm).  Gastrointestinal: Negative.   Genitourinary: Negative.   Musculoskeletal: Negative.   Neurological: Tingling: left arm.  Psychiatric/Behavioral: Negative.   All other systems reviewed and are negative.   PHYSICAL EXAM: VS:  BP 140/80   Pulse 62   Ht 5\' 8"  (1.727 m)   Wt 180 lb (81.6 kg)   BMI 27.37 kg/m  , BMI Body mass index is 27.37 kg/m.  GEN: Well nourished, well developed, in no acute distress HEENT: normal Neck: no JVD, carotid bruits, or masses Cardiac: RRR; no murmurs, rubs, or gallops,no edema  Respiratory:  clear to auscultation bilaterally, normal work of breathing GI: soft, nontender, nondistended, + BS MS: no deformity or atrophy Skin: warm and dry, no rash Neuro:  Strength and sensation are intact Psych: euthymic mood, full affect   RECENT LABS: 04/07/2018: ALT 16 04/16/2018: BUN 9; Creatinine, Ser 0.88; Hemoglobin 12.0; Platelets 386; Potassium 4.0; Sodium 139    LIPID PANEL: Lab Results  Component Value Date   CHOL 145 04/10/2018   HDL 50 04/10/2018   LDLCALC 78 04/10/2018   TRIG 84 04/10/2018      WEIGHT: Wt Readings from Last 3 Encounters:  05/16/18 180 lb (81.6 kg)  05/07/18 178 lb 8 oz (81 kg)  04/16/18 183 lb 8 oz (83.2 kg)       ASSESSMENT AND PLAN:  Acute myocarditis, unspecified myocarditis type Symptoms have returned after holding NSAIDs Recommend she restart ibuprofen 600 mg 3 times daily and continue colchicine twice daily After 2 weeks if symptoms have dramatically improve with then decrease ibuprofen down to twice daily with colchicine twice daily for several more weeks -Suggested to her that she contact her office as she slowly adjust  her medication Will need to do a slow wean off the NSAIDs and continue colchicine This process may take many weeks to months given recurrence of her symptoms  If she has recurrence of her symptoms, we will go back up on the dose of the NSAIDs and consider referral to rheumatology  Chest pain, unspecified type  Secondary to inflammatory process, myopericarditis Work-up in the hospital, no further ischemic work-up needed We will stop Imdur as this is causing a headache with no pain relief Less likely spasm  Disposition:   F/U  2 months  Total encounter time more than 25 minutes. Greater than 50% was spent in counseling and coordination of care with the patient.   Orders Placed This Encounter  Procedures  . EKG 12-Lead    I, Jesus Reyes am acting as a scribe for Julien Nordmann, M.D., Ph.D.  I, Julien Nordmann, M.D. Ph.D., have reviewed the above documentation for accuracy and completeness, and I agree with the above.   Signed, Dossie Arbour, M.D., Ph.D. 05/16/2018  [facility] [place], [place] [phone]

## 2018-05-16 NOTE — Telephone Encounter (Signed)
Is it possible for her to be added to someone's schedule today, in order to avoid the ED.  ECG would be helpful to screen. May require resumption of nsaid and repeat echo.

## 2018-05-16 NOTE — Patient Instructions (Signed)
Medication Instructions:   Restart ibuprofen 600 mg three times a day Stay on colchicine twice a day Start omeprazole 20 mg daily to protect your stomach  After 2-3  weeks if pain better, we could try ibuprofen twice a day with colchicine  If you need a refill on your cardiac medications before your next appointment, please call your pharmacy.    Lab work: No new labs needed   If you have labs (blood work) drawn today and your tests are completely normal, you will receive your results only by: Marland Kitchen MyChart Message (if you have MyChart) OR . A paper copy in the mail If you have any lab test that is abnormal or we need to change your treatment, we will call you to review the results.   Testing/Procedures: No new testing needed   Follow-Up: At [facility], you and your health needs are our priority.  As part of our continuing mission to provide you with exceptional heart care, we have created designated Provider Care Teams.  These Care Teams include your primary Cardiologist (physician) and Advanced Practice Providers (APPs -  Physician Assistants and Nurse Practitioners) who all work together to provide you with the care you need, when you need it.  . You will need a follow up appointment in 2 month  . Providers on your designated Care Team:   . Nicolasa Ducking, NP . Eula Listen, PA-C . Marisue Ivan, PA-C  Any Other Special Instructions Will Be Listed Below (If Applicable).  For educational health videos Log in to : [email or web address] Or : [email or web address], password : [email or web address]

## 2018-06-27 ENCOUNTER — Telehealth: Payer: Self-pay | Admitting: *Deleted

## 2018-06-27 NOTE — Telephone Encounter (Signed)
Virtual Visit Pre-Appointment Phone Call  "(Name), I am calling you today to discuss your upcoming appointment. We are currently trying to limit exposure to the virus that causes COVID-19 by seeing patients at home rather than in the office."  1. "What is the BEST phone number to call the day of the visit?" - include this in appointment notes  2. "Do you have or have access to (through a family member/friend) a smartphone with video capability that we can use for your visit?" a. If yes - list this number in appt notes as "cell" (if different from BEST phone #) and list the appointment type as a VIDEO visit in appointment notes b. If no - list the appointment type as a PHONE visit in appointment notes  3. Confirm consent - "In the setting of the current Covid19 crisis, you are scheduled for a (video) visit with your provider on (07/12/2018) at (9:30 am).  Just as we do with many in-office visits, in order for you to participate in this visit, we must obtain consent.  If you'd like, I can send this to your mychart (if signed up) or email for you to review.  Otherwise, I can obtain your verbal consent now.  All virtual visits are billed to your insurance company just like a normal visit would be.  By agreeing to a virtual visit, we'd like you to understand that the technology does not allow for your provider to perform an examination, and thus may limit your provider's ability to fully assess your condition. If your provider identifies any concerns that need to be evaluated in person, we will make arrangements to do so.  Finally, though the technology is pretty good, we cannot assure that it will always work on either your or our end, and in the setting of a video visit, we may have to convert it to a phone-only visit.  In either situation, we cannot ensure that we have a secure connection.  Are you willing to proceed?" yes  4. Advise patient to be prepared - "Two hours prior to your appointment, go  ahead and check your blood pressure, pulse, oxygen saturation, and your weight (if you have the equipment to check those) and write them all down. When your visit starts, your provider will ask you for this information. If you have an Apple Watch or Kardia device, please plan to have heart rate information ready on the day of your appointment. Please have a pen and paper handy nearby the day of the visit as well."  5. Give patient instructions for MyChart download to smartphone OR Doximity/Doxy.me as below if video visit (depending on what platform provider is using)  6. Inform patient they will receive a phone call 15 minutes prior to their appointment time (may be from unknown caller ID) so they should be prepared to answer    TELEPHONE CALL NOTE  Caroline SINER has been deemed a candidate for a follow-up tele-health visit to limit community exposure during the Covid-19 pandemic. I spoke with the patient via phone to ensure availability of phone/video source, confirm preferred email & phone number, and discuss instructions and expectations.  I reminded Caroline Garcia to be prepared with any vital sign and/or heart rhythm information that could potentially be obtained via home monitoring, at the time of her visit. I reminded Caroline Garcia to expect a phone call prior to her visit.  Garcia, Caroline C, CMA 06/27/2018 11:27 AM   INSTRUCTIONS FOR  DOWNLOADING THE MYCHART APP TO SMARTPHONE  - The patient must first make sure to have activated MyChart and know their login information - If Apple, go to [employer] and type in MyChart in the search bar and download the app. If Android, ask patient to go to [employer] and type in [place] in the search bar and download the app. The app is free but as with any other app downloads, their phone may require them to verify saved payment information or Apple/Android password.  - The patient will need to then log into the app with their MyChart  username and password, and select [facility] as their healthcare provider to link the account. When it is time for your visit, go to the MyChart app, find appointments, and click Begin Video Visit. Be sure to Select Allow for your device to access the Microphone and Camera for your visit. You will then be connected, and your provider will be with you shortly.  **If they have any issues connecting, or need assistance please contact MyChart service desk [phone])**  **If using a computer, in order to ensure the best quality for their visit they will need to use either of the following Internet Browsers: [employer], or Google Chrome**  IF USING DOXIMITY or DOXY.ME - The patient will receive a link just prior to their visit by text.     FULL LENGTH CONSENT FOR TELE-HEALTH VISIT   I hereby voluntarily request, consent and authorize [facility] and its employed or contracted physicians, physician assistants, nurse practitioners or other licensed health care professionals (the Practitioner), to provide me with telemedicine health care services (the "Services") as deemed necessary by the treating Practitioner. I acknowledge and consent to receive the Services by the Practitioner via telemedicine. I understand that the telemedicine visit will involve communicating with the Practitioner through live audiovisual communication technology and the disclosure of certain medical information by electronic transmission. I acknowledge that I have been given the opportunity to request an in-person assessment or other available alternative prior to the telemedicine visit and am voluntarily participating in the telemedicine visit.  I understand that I have the right to withhold or withdraw my consent to the use of telemedicine in the course of my care at any time, without affecting my right to future care or treatment, and that the Practitioner or I may terminate the telemedicine visit at any  time. I understand that I have the right to inspect all information obtained and/or recorded in the course of the telemedicine visit and may receive copies of available information for a reasonable fee.  I understand that some of the potential risks of receiving the Services via telemedicine include:  Marland Kitchen Delay or interruption in medical evaluation due to technological equipment failure or disruption; . Information transmitted may not be sufficient (e.g. poor resolution of images) to allow for appropriate medical decision making by the Practitioner; and/or  . In rare instances, security protocols could fail, causing a breach of personal health information.  Furthermore, I acknowledge that it is my responsibility to provide information about my medical history, conditions and care that is complete and accurate to the best of my ability. I acknowledge that Practitioner's advice, recommendations, and/or decision may be based on factors not within their control, such as incomplete or inaccurate data provided by me or distortions of diagnostic images or specimens that may result from electronic transmissions. I understand that the practice of medicine is not an exact science and that  Practitioner makes no warranties or guarantees regarding treatment outcomes. I acknowledge that I will receive a copy of this consent concurrently upon execution via email to the email address I last provided but may also request a printed copy by calling the office of [facility].    I understand that my insurance will be billed for this visit.   I have read or had this consent read to me. . I understand the contents of this consent, which adequately explains the benefits and risks of the Services being provided via telemedicine.  . I have been provided ample opportunity to ask questions regarding this consent and the Services and have had my questions answered to my satisfaction. . I give my informed consent for the services  to be provided through the use of telemedicine in my medical care  By participating in this telemedicine visit I agree to the above.

## 2018-07-12 ENCOUNTER — Encounter: Payer: Self-pay | Admitting: Internal Medicine

## 2018-07-12 ENCOUNTER — Ambulatory Visit: Payer: BLUE CROSS/BLUE SHIELD | Admitting: Internal Medicine

## 2018-07-12 ENCOUNTER — Telehealth (INDEPENDENT_AMBULATORY_CARE_PROVIDER_SITE_OTHER): Payer: BLUE CROSS/BLUE SHIELD | Admitting: Internal Medicine

## 2018-07-12 ENCOUNTER — Other Ambulatory Visit: Payer: Self-pay

## 2018-07-12 VITALS — Ht 67.0 in | Wt 181.0 lb

## 2018-07-12 DIAGNOSIS — I319 Disease of pericardium, unspecified: Secondary | ICD-10-CM | POA: Diagnosis not present

## 2018-07-12 NOTE — Progress Notes (Signed)
Virtual Visit via Video Note   This visit type was conducted due to national recommendations for restrictions regarding the COVID-19 Pandemic (e.g. social distancing) in an effort to limit this patient's exposure and mitigate transmission in our community.  Due to her co-morbid illnesses, this patient is at least at moderate risk for complications without adequate follow up.  This format is felt to be most appropriate for this patient at this time.  All issues noted in this document were discussed and addressed.  A limited physical exam was performed with this format.  Please refer to the patient's chart for her consent to telehealth for [facility].   Date:  07/12/2018   ID:  Caroline Garcia, DOB 09/29/1960, MRN [id number]  Patient Location: Home Provider Location: Office  PCP:  Patient, No Pcp Per  Cardiologist:  Yvonne Kendall, MD  Electrophysiologist:  None   Evaluation Performed:  Follow-Up Visit  Chief Complaint: Follow-up myopericarditis  History of Present Illness:    Caroline Garcia is a 58 y.o. female with history of suspected myopericarditis with admission to [facility] in 03/2011, with whom I am speaking for follow-up of chest pain.  She initially presented to [facility] on 04/09/2018 with chest pain and was referred for emergent cardiac catheterization out of concern for lateral STEMI.  Catheterization showed clean coronaries with normal LVEF.  She was treated for suspected myopericarditis, with troponin peaking at 15.7.  She was last seen in 04/2018 by Dr. Mariah Milling for recurrent chest pain.  Symptoms were suspicious for recurrent myopericarditis, and the patient was advised to restart ibuprofen 600 mg 3 times daily and continue standing colchicine.  Today, Caroline Garcia that she is doing very well.  Her chest pain resolved within a few days of being restarted on ibuprofen.  She is currently taking ibuprofen 600 mg twice daily as well as colchicine 0.6 mg twice daily.  She denies  side effects, including GI upset, melena, and hematochezia.  She has been unable to go to the gym due to the COVID-19 pandemic but is trying to walk some.  She denies shortness of breath, palpitations, lightheadedness, and edema.  CP resolved within a few days of starting ibuprofen.  Still taking 600 mg BID.  No other sx.  Started walking daily.  The patient does not have symptoms concerning for COVID-19 infection (fever, chills, cough, or new shortness of breath).    Past Medical History:  Diagnosis Date   History of echocardiogram    a.  TTE 03/2018: EF of 60 to 65%, normal LV cavity size, normal LV diastolic function parameters, no regional wall motion abnormalities, normal RV systolic function with normal RV cavity size and normal RV wall thickness.  There were no significant valvular abnormalities; b. 04/2018 Echo: EF 60-65%, nl RV fxn. No pericardial effusion.   Myocarditis (HCC)    a.  LHC 03/2018: No significant CAD by angiography, normal LVEF, normal LV filling pressure   PONV (postoperative nausea and vomiting)    UTI (urinary tract infection)    hx   Past Surgical History:  Procedure Laterality Date   ANTERIOR CERVICAL DECOMP/DISCECTOMY FUSION N/A 04/29/2012   Procedure: C5-6, C6-7 Anterior Cervical Discectomy and Fusion, Allograft, Plate;  Surgeon: Eldred Manges, MD;  Location: [facility] OR;  Service: Orthopedics;  Laterality: N/A;  C5-6, C6-7 Anterior Cervical Discectomy and Fusion, Allograft and Plate   CHOLECYSTECTOMY     LEFT HEART CATH AND CORONARY ANGIOGRAPHY N/A 04/09/2018   Procedure: LEFT HEART CATH  AND CORONARY ANGIOGRAPHY;  Surgeon: Yvonne KendallEnd, Kiarah Eckstein, MD;  Location: [facility] INVASIVE CV LAB;  Service: Cardiovascular;  Laterality: N/A;     Current Meds  Medication Sig   colchicine 0.6 MG tablet Take 1 tablet (0.6 mg total) by mouth 2 (two) times daily.   ibuprofen (ADVIL,MOTRIN) 600 MG tablet Take 1 tablet (600 mg total) by mouth 3 (three) times daily.   nitroGLYCERIN  (NITROSTAT) 0.4 MG SL tablet Place 1 tablet (0.4 mg total) under the tongue every 5 (five) minutes as needed for chest pain.   omeprazole (PRILOSEC) 20 MG capsule Take 1 capsule (20 mg total) by mouth daily.     Allergies:   Patient has no known allergies.   Social History   Tobacco Use   Smoking status: Never Smoker   Smokeless tobacco: Never Used  Substance Use Topics   Alcohol use: No   Drug use: No     Family Hx: The patient's family history includes Diabetes in her maternal grandmother; Heart disease in her maternal grandmother; Heart failure in her father; Hyperlipidemia in her father and maternal grandmother; Hypertension in her father and maternal grandmother.  ROS:   Please see the history of present illness.   All other systems reviewed and are negative.   Prior CV studies:   The following studies were reviewed today:  Limited TTE (05/03/2018): Normal LV size with LVEF 60-65%.  Normal RV size and function.  No pericardial effusion.  TTE (04/10/2018): Normal LV size with LVEF 60-65% with normal diastolic function and wall motion.  Normal RV size and function.  Elevated central venous pressure.  LHC (04/09/2018): No angiographically significant coronary artery disease.  Normal LVEF and filling pressure.  Labs/Other Tests and Data Reviewed:    EKG:  An ECG dated 05/16/2018 was personally reviewed today and demonstrated:  Normal sinus rhythm with anterolateral and inferior T wave inversions.  Recent Labs: 04/07/2018: ALT 16 04/16/2018: BUN 9; Creatinine, Ser 0.88; Hemoglobin 12.0; Platelets 386; Potassium 4.0; Sodium 139   Recent Lipid Panel Lab Results  Component Value Date/Time   CHOL 145 04/10/2018 06:22 AM   TRIG 84 04/10/2018 06:22 AM   HDL 50 04/10/2018 06:22 AM   CHOLHDL 2.9 04/10/2018 06:22 AM   LDLCALC 78 04/10/2018 06:22 AM    Wt Readings from Last 3 Encounters:  07/12/18 181 lb (82.1 kg)  05/16/18 180 lb (81.6 kg)  05/07/18 178 lb 8 oz (81 kg)      Objective:    Vital Signs:  Ht 5\' 7"  (1.702 m)    Wt 181 lb (82.1 kg)    BMI 28.35 kg/m    VITAL SIGNS:  reviewed GEN:  no acute distress  ASSESSMENT & PLAN:    Myopericarditis: Recurrent chest pain in March resolved promptly with reinitiation of high-dose NSAIDs.  As she has been asymptomatic for over a month now, I would like to gradually wean her off ibuprofen to minimize the risk for GI and renal side effects.  I have recommended decreasing ibuprofen to 300 mg twice daily x1 week, followed by ibuprofen 300 mg daily x1 week.  She should stop ibuprofen altogether thereafter but continue on colchicine 0.6 mg twice daily until she is seen for follow-up.  We will plan to complete at least 6 months of colchicine therapy for recurrent myopericarditis.  She should let us know if she has any recurrent chest pain in the meantime.  I encouraged her to continue walking and increase her activity, as tolerated.  COVID-19  Education: The signs and symptoms of COVID-19 were discussed with the patient and how to seek care for testing (follow up with PCP or arrange E-visit).  The importance of social distancing was discussed today.  Time:   Today, I have spent 10 minutes with the patient with telehealth technology discussing the above problems.  An additional 10 minutes were spent reviewing the patient's chart and documenting today's encounter.  Medication Adjustments/Labs and Tests Ordered: Current medicines are reviewed at length with the patient today.  Concerns regarding medicines are outlined above.   Tests Ordered: None.  Medication Changes: Decrease ibuprofen to 300 mg BID x 1 week, then 300 mg daily x 1 week, then stop.  Disposition:  Follow up in 4 month(s)  Signed, Yvonne Kendall, MD  07/12/2018 9:24 AM    [facility]

## 2018-07-12 NOTE — Patient Instructions (Signed)
Medication Instructions:  Your physician has recommended you make the following change in your medication:   - DECREASE Ibuprofen to 300mg  twice daily for 1 week. Then DECREASE Ibuprofen to 300mg  daily for 1 week. Then STOP.  (OK to cut your 600mg  tablets in 1/2 and take 1/2 tablet as instructed above)  If you need a refill on your cardiac medications before your next appointment, please call your pharmacy.   Lab work: None ordered If you have labs (blood work) drawn today and your tests are completely normal, you will receive your results only by: Marland Kitchen MyChart Message (if you have MyChart) OR . A paper copy in the mail If you have any lab test that is abnormal or we need to change your treatment, we will call you to review the results.  Testing/Procedures: None ordered  Follow-Up: At [facility], you and your health needs are our priority.  As part of our continuing mission to provide you with exceptional heart care, we have created designated Provider Care Teams.  These Care Teams include your primary Cardiologist (physician) and Advanced Practice Providers (APPs -  Physician Assistants and Nurse Practitioners) who all work together to provide you with the care you need, when you need it. You will need a follow up appointment in 4 months.  Please call our office 2 months in advance to schedule this appointment.  You may see Yvonne Kendall, MD or one of the following Advanced Practice Providers on your designated Care Team:   Nicolasa Ducking, NP Eula Listen, PA-C . Marisue Ivan, PA-C

## 2018-11-12 NOTE — Progress Notes (Signed)
Follow-up Outpatient Visit Date: 11/13/2018  Primary Care Provider: Patient, No Pcp Per No address on file  Chief Complaint: Follow-up myopericarditis  HPI:  Caroline Garcia is a 58 y.o. year-old female with history of suspected myopericarditis in 03/2018, who presents for follow-up of chest pain.  We last spoke in May, at which time she was doing well after having been restarted on ibuprofen for presumed recurrent pericarditis.  She also remained on colchicine 0.6 mg twice daily without any side effects.  Today, Caroline Garcia reports that she has been doing well.  She notes one episode of substernal chest pain that lasted only a few minutes about a month ago.  It was similar to what she experienced in the past with the myopericarditis, albeit less severe and much shorter.  She did not take any medications for this.  She remains on colchicine 0.6 mg twice daily, which she is tolerating well.  Caroline Garcia was able to wean off ibuprofen, as we discussed at our virtual visit in May, without any problems.  She has not had any shortness of breath, palpitations, lightheadedness, or edema.  She walks 4 miles a day, 3 times a week without any difficulty.  --------------------------------------------------------------------------------------------------  Past Medical History:  Diagnosis Date  . History of echocardiogram    a.  TTE 03/2018: EF of 60 to 65%, normal LV cavity size, normal LV diastolic function parameters, no regional wall motion abnormalities, normal RV systolic function with normal RV cavity size and normal RV wall thickness.  There were no significant valvular abnormalities; b. 04/2018 Echo: EF 60-65%, nl RV fxn. No pericardial effusion.  . Myocarditis (HCC)    a.  LHC 03/2018: No significant CAD by angiography, normal LVEF, normal LV filling pressure  . PONV (postoperative nausea and vomiting)   . UTI (urinary tract infection)    hx   Past Surgical History:  Procedure Laterality Date  .  ANTERIOR CERVICAL DECOMP/DISCECTOMY FUSION N/A 04/29/2012   Procedure: C5-6, C6-7 Anterior Cervical Discectomy and Fusion, Allograft, Plate;  Surgeon: Eldred Manges, MD;  Location: [facility] OR;  Service: Orthopedics;  Laterality: N/A;  C5-6, C6-7 Anterior Cervical Discectomy and Fusion, Allograft and Plate  . CHOLECYSTECTOMY    . LEFT HEART CATH AND CORONARY ANGIOGRAPHY N/A 04/09/2018   Procedure: LEFT HEART CATH AND CORONARY ANGIOGRAPHY;  Surgeon: Yvonne Kendall, MD;  Location: [facility] INVASIVE CV LAB;  Service: Cardiovascular;  Laterality: N/A;    Current Meds  Medication Sig  . colchicine 0.6 MG tablet Staring 11/13/18 decrease dosage to 1 tablet daily for 2 week then STOP  . nitroGLYCERIN (NITROSTAT) 0.4 MG SL tablet Place 1 tablet (0.4 mg total) under the tongue every 5 (five) minutes as needed for chest pain.  . [DISCONTINUED] colchicine 0.6 MG tablet Take 1 tablet (0.6 mg total) by mouth 2 (two) times daily.    Allergies: Patient has no known allergies.  Social History   Tobacco Use  . Smoking status: Never Smoker  . Smokeless tobacco: Never Used  Substance Use Topics  . Alcohol use: No  . Drug use: No    Family History  Problem Relation Age of Onset  . Hypertension Father   . Hyperlipidemia Father   . Heart failure Father   . Heart disease Maternal Grandmother   . Hyperlipidemia Maternal Grandmother   . Hypertension Maternal Grandmother   . Diabetes Maternal Grandmother     Review of Systems: A 12-system review of systems was performed and was negative except as  noted in the HPI.  --------------------------------------------------------------------------------------------------  Physical Exam: BP 110/84 (BP Location: Left Arm, Patient Position: Sitting, Cuff Size: Normal)   Pulse (!) 57   Ht 5\' 7"  (1.702 m)   Wt 191 lb (86.6 kg)   SpO2 98%   BMI 29.91 kg/m   General: NAD. HEENT: No conjunctival pallor or scleral icterus.  Facemask in place. Neck: Supple without  lymphadenopathy, thyromegaly, JVD, or HJR. Lungs: Normal work of breathing. Clear to auscultation bilaterally without wheezes or crackles. Heart: Bradycardic but regular without murmurs, rubs, or gallops. Non-displaced PMI. Abd: Bowel sounds present. Soft, NT/ND without hepatosplenomegaly Ext: No lower extremity edema. Radial, PT, and DP pulses are 2+ bilaterally. Skin: Warm and dry without rash.  EKG: Sinus bradycardia (heart rate 57 bpm) with sinus arrhythmia.  No significant abnormality.  Lab Results  Component Value Date   WBC 9.3 04/16/2018   HGB 12.0 04/16/2018   HCT 37.4 04/16/2018   MCV 90.6 04/16/2018   PLT 386 04/16/2018    Lab Results  Component Value Date   NA 139 04/16/2018   K 4.0 04/16/2018   CL 106 04/16/2018   CO2 26 04/16/2018   BUN 9 04/16/2018   CREATININE 0.88 04/16/2018   GLUCOSE 96 04/16/2018   ALT 16 04/07/2018    Lab Results  Component Value Date   CHOL 145 04/10/2018   HDL 50 04/10/2018   LDLCALC 78 04/10/2018   TRIG 84 04/10/2018   CHOLHDL 2.9 04/10/2018    --------------------------------------------------------------------------------------------------  ASSESSMENT AND PLAN: Myopericarditis: Patient doing well with only one brief episode of chest pain lasting a few minutes about a month ago.  I am uncertain if this even represents myopericarditis, given its brief and self-limited nature.  Caroline Garcia was able to wean herself off NSAIDs without difficulty.  As she is now 6 months out from most recent flare, we have agreed to wean her off colchicine.  I will have her decrease colchicine to 0.6 mg daily x2 weeks and then stop altogether.  She should let us know if she has recurrence of chest pain or other concerns.  Follow-up: Virtual visit in 6 months.  Nelva Bush, MD 11/13/2018 10:53 AM

## 2018-11-13 ENCOUNTER — Encounter: Payer: Self-pay | Admitting: Internal Medicine

## 2018-11-13 ENCOUNTER — Other Ambulatory Visit: Payer: Self-pay

## 2018-11-13 ENCOUNTER — Ambulatory Visit (INDEPENDENT_AMBULATORY_CARE_PROVIDER_SITE_OTHER): Payer: BLUE CROSS/BLUE SHIELD | Admitting: Internal Medicine

## 2018-11-13 VITALS — BP 110/84 | HR 57 | Ht 67.0 in | Wt 191.0 lb

## 2018-11-13 DIAGNOSIS — I319 Disease of pericardium, unspecified: Secondary | ICD-10-CM

## 2018-11-13 MED ORDER — COLCHICINE 0.6 MG PO TABS: ORAL_TABLET | ORAL | Status: DC

## 2018-11-13 NOTE — Patient Instructions (Signed)
Medication Instructions:  Your physician has recommended you make the following change in your medication:   DECREASE Colchicine to 1 tablet (0.6mg ) daily for 2 weeks then STOP  If you need a refill on your cardiac medications before your next appointment, please call your pharmacy.   Lab work: None ordered If you have labs (blood work) drawn today and your tests are completely normal, you will receive your results only by: Marland Kitchen MyChart Message (if you have MyChart) OR . A paper copy in the mail If you have any lab test that is abnormal or we need to change your treatment, we will call you to review the results.  Testing/Procedures: None ordered  Follow-Up: At [facility], you and your health needs are our priority.  As part of our continuing mission to provide you with exceptional heart care, we have created designated Provider Care Teams.  These Care Teams include your primary Cardiologist (physician) and Advanced Practice Providers (APPs -  Physician Assistants and Nurse Practitioners) who all work together to provide you with the care you need, when you need it. You will need a follow up virtual appointment in 6 months.  Please call our office 2 months in advance to schedule this appointment.  You may see Nelva Bush, MD or one of the following Advanced Practice Providers on your designated Care Team:   Murray Hodgkins, NP Christell Faith, PA-C . Marrianne Mood, PA-C  Any Other Special Instructions Will Be Listed Below (If Applicable). N/A

## 2018-11-14 NOTE — Addendum Note (Signed)
Addended by: Raelene Bott, BRANDY L on: 11/14/2018 02:10 PM   Modules accepted: Orders

## 2018-12-10 DIAGNOSIS — Z23 Encounter for immunization: Secondary | ICD-10-CM | POA: Diagnosis not present

## 2019-01-31 DIAGNOSIS — Z23 Encounter for immunization: Secondary | ICD-10-CM | POA: Diagnosis not present

## 2019-07-14 ENCOUNTER — Telehealth: Payer: Self-pay | Admitting: Internal Medicine

## 2019-07-14 NOTE — Telephone Encounter (Signed)
  Patient Consent for Virtual Visit         Caroline Garcia has provided verbal consent on 07/14/2019 for a virtual visit (video or telephone).   CONSENT FOR VIRTUAL VISIT FOR:  Caroline Garcia  By participating in this virtual visit I agree to the following:  I hereby voluntarily request, consent and authorize [facility] and its employed or contracted physicians, physician assistants, nurse practitioners or other licensed health care professionals (the Practitioner), to provide me with telemedicine health care services (the "Services") as deemed necessary by the treating Practitioner. I acknowledge and consent to receive the Services by the Practitioner via telemedicine. I understand that the telemedicine visit will involve communicating with the Practitioner through live audiovisual communication technology and the disclosure of certain medical information by electronic transmission. I acknowledge that I have been given the opportunity to request an in-person assessment or other available alternative prior to the telemedicine visit and am voluntarily participating in the telemedicine visit.  I understand that I have the right to withhold or withdraw my consent to the use of telemedicine in the course of my care at any time, without affecting my right to future care or treatment, and that the Practitioner or I may terminate the telemedicine visit at any time. I understand that I have the right to inspect all information obtained and/or recorded in the course of the telemedicine visit and may receive copies of available information for a reasonable fee.  I understand that some of the potential risks of receiving the Services via telemedicine include:  Marland Kitchen Delay or interruption in medical evaluation due to technological equipment failure or disruption; . Information transmitted may not be sufficient (e.g. poor resolution of images) to allow for appropriate medical decision making by the  Practitioner; and/or  . In rare instances, security protocols could fail, causing a breach of personal health information.  Furthermore, I acknowledge that it is my responsibility to provide information about my medical history, conditions and care that is complete and accurate to the best of my ability. I acknowledge that Practitioner's advice, recommendations, and/or decision may be based on factors not within their control, such as incomplete or inaccurate data provided by me or distortions of diagnostic images or specimens that may result from electronic transmissions. I understand that the practice of medicine is not an exact science and that Practitioner makes no warranties or guarantees regarding treatment outcomes. I acknowledge that a copy of this consent can be made available to me via my patient portal [facility] MyChart), or I can request a printed copy by calling the office of [facility].    I understand that my insurance will be billed for this visit.   I have read or had this consent read to me. . I understand the contents of this consent, which adequately explains the benefits and risks of the Services being provided via telemedicine.  . I have been provided ample opportunity to ask questions regarding this consent and the Services and have had my questions answered to my satisfaction. . I give my informed consent for the services to be provided through the use of telemedicine in my medical care

## 2019-08-05 ENCOUNTER — Telehealth: Payer: BC Managed Care – PPO | Admitting: Physician Assistant

## 2019-08-26 ENCOUNTER — Telehealth: Payer: Self-pay | Admitting: Internal Medicine

## 2019-08-26 NOTE — Telephone Encounter (Signed)
Patient has been contacted 5 times for a recall, recall has been deleted

## 2019-09-15 DIAGNOSIS — H109 Unspecified conjunctivitis: Secondary | ICD-10-CM | POA: Diagnosis not present

## 2019-09-16 DIAGNOSIS — H1045 Other chronic allergic conjunctivitis: Secondary | ICD-10-CM | POA: Diagnosis not present

## 2019-09-26 DIAGNOSIS — H04123 Dry eye syndrome of bilateral lacrimal glands: Secondary | ICD-10-CM | POA: Diagnosis not present

## 2019-11-21 NOTE — Progress Notes (Signed)
Follow-up Outpatient Visit Date: 11/26/2019  Primary Care Provider: Patient, No Pcp Per No address on file  Chief Complaint: Follow-up myopericarditis  HPI:  Caroline Garcia is a 59 y.o. female with history of suspected myopericarditis in 03/2018, who presents for follow-up of chest pain.  I last saw her a year ago at which time she reported only a single episode of brief substernal chest pain lasting a few minutes over the preceding month.  She remained on colchicine 0.6 mg twice daily.  We agreed to wean off of colchicine at that time, given that she had completed 6 months of therapy.  Today, Caroline Garcia reports that she has been doing relatively well.  She has noticed a few transient episodes of vague discomfort in her chest when pushing herself at the gym.  She reports feeling a little out of breath and having minor sharpness in her chest that would resolve within about a minute.  She has not had any other chest pain nor palpitations, lightheadedness, or edema.  --------------------------------------------------------------------------------------------------  Past Medical History:  Diagnosis Date  . History of echocardiogram    a.  TTE 03/2018: EF of 60 to 65%, normal LV cavity size, normal LV diastolic function parameters, no regional wall motion abnormalities, normal RV systolic function with normal RV cavity size and normal RV wall thickness.  There were no significant valvular abnormalities; b. 04/2018 Echo: EF 60-65%, nl RV fxn. No pericardial effusion.  . Myocarditis (HCC)    a.  LHC 03/2018: No significant CAD by angiography, normal LVEF, normal LV filling pressure  . PONV (postoperative nausea and vomiting)   . UTI (urinary tract infection)    hx   Past Surgical History:  Procedure Laterality Date  . ANTERIOR CERVICAL DECOMP/DISCECTOMY FUSION N/A 04/29/2012   Procedure: C5-6, C6-7 Anterior Cervical Discectomy and Fusion, Allograft, Plate;  Surgeon: Eldred Manges, MD;  Location: [facility]  OR;  Service: Orthopedics;  Laterality: N/A;  C5-6, C6-7 Anterior Cervical Discectomy and Fusion, Allograft and Plate  . CHOLECYSTECTOMY    . LEFT HEART CATH AND CORONARY ANGIOGRAPHY N/A 04/09/2018   Procedure: LEFT HEART CATH AND CORONARY ANGIOGRAPHY;  Surgeon: Yvonne Kendall, MD;  Location: [facility] INVASIVE CV LAB;  Service: Cardiovascular;  Laterality: N/A;    Current Meds  Medication Sig  . nitroGLYCERIN (NITROSTAT) 0.4 MG SL tablet Place 1 tablet (0.4 mg total) under the tongue every 5 (five) minutes as needed for chest pain (maximum of 3 doses.).  . [DISCONTINUED] nitroGLYCERIN (NITROSTAT) 0.4 MG SL tablet Place 1 tablet (0.4 mg total) under the tongue every 5 (five) minutes as needed for chest pain.    Allergies: Patient has no known allergies.  Social History   Tobacco Use  . Smoking status: Never Smoker  . Smokeless tobacco: Never Used  Substance Use Topics  . Alcohol use: No  . Drug use: No    Family History  Problem Relation Age of Onset  . Hypertension Father   . Hyperlipidemia Father   . Heart failure Father   . Heart disease Maternal Grandmother   . Hyperlipidemia Maternal Grandmother   . Hypertension Maternal Grandmother   . Diabetes Maternal Grandmother     Review of Systems: A 12-system review of systems was performed and was negative except as noted in the HPI.  --------------------------------------------------------------------------------------------------  Physical Exam: BP 116/84 (BP Location: Left Arm, Patient Position: Sitting, Cuff Size: Normal)   Pulse 64   Ht 5' 7.5" (1.715 m)   Wt 198 lb 8  oz (90 kg)   SpO2 98%   BMI 30.63 kg/m   General: NAD. Neck: No JVD or HJR. Lungs: Clear to auscultation without wheezes or crackles. Heart: Regular rate and rhythm without murmurs, rubs, or gallops. Abdomen: Soft, nontender, nondistended. Extremities: No lower extremity edema.  EKG: Normal sinus rhythm without abnormality.  Lab Results   Component Value Date   WBC 9.3 04/16/2018   HGB 12.0 04/16/2018   HCT 37.4 04/16/2018   MCV 90.6 04/16/2018   PLT 386 04/16/2018    Lab Results  Component Value Date   NA 139 04/16/2018   K 4.0 04/16/2018   CL 106 04/16/2018   CO2 26 04/16/2018   BUN 9 04/16/2018   CREATININE 0.88 04/16/2018   GLUCOSE 96 04/16/2018   ALT 16 04/07/2018    Lab Results  Component Value Date   CHOL 145 04/10/2018   HDL 50 04/10/2018   LDLCALC 78 04/10/2018   TRIG 84 04/10/2018   CHOLHDL 2.9 04/10/2018    --------------------------------------------------------------------------------------------------  ASSESSMENT AND PLAN: Myopericarditis: No symptoms to suggest recurrence.  Transient sharp chest pain with activity is unlikely to be cardiac in nature, with catheterization at the time of myopericarditis diagnosis in 2020 showing no significant CAD.  Subsequent echoes have all shown preserved LVEF.  Will defer further work-up and intervention at this time.  I advised Caroline Garcia to contact us if she were to have recurrent chest pain.  Follow-up: Return to clinic in 1 year.  Yvonne Kendall, MD 11/26/2019 9:25 AM

## 2019-11-26 ENCOUNTER — Other Ambulatory Visit: Payer: Self-pay

## 2019-11-26 ENCOUNTER — Encounter: Payer: Self-pay | Admitting: Internal Medicine

## 2019-11-26 ENCOUNTER — Ambulatory Visit: Payer: BLUE CROSS/BLUE SHIELD | Admitting: Internal Medicine

## 2019-11-26 VITALS — BP 116/84 | HR 64 | Ht 67.5 in | Wt 198.5 lb

## 2019-11-26 DIAGNOSIS — I319 Disease of pericardium, unspecified: Secondary | ICD-10-CM | POA: Diagnosis not present

## 2019-11-26 MED ORDER — NITROGLYCERIN 0.4 MG SL SUBL: 0.4000 mg | SUBLINGUAL_TABLET | SUBLINGUAL | 3 refills | Status: DC | PRN

## 2019-11-26 NOTE — Patient Instructions (Addendum)
Medication Instructions:  Your physician recommends that you continue on your current medications as directed. Please refer to the Current Medication list given to you today.  *If you need a refill on your cardiac medications before your next appointment, please call your pharmacy*  Follow-Up: At [facility], you and your health needs are our priority.  As part of our continuing mission to provide you with exceptional heart care, we have created designated Provider Care Teams.  These Care Teams include your primary Cardiologist (physician) and Advanced Practice Providers (APPs -  Physician Assistants and Nurse Practitioners) who all work together to provide you with the care you need, when you need it.  We recommend signing up for the patient portal called "MyChart".  Sign up information is provided on this After Visit Summary.  MyChart is used [email or web address] connect with patients for Virtual Visits (Telemedicine).  Patients are able to view lab/test results, encounter notes, upcoming appointments, etc.  Non-urgent messages can be sent to your provider as well.   To learn more about what you can do with MyChart, go to [email or web address].    Your next appointment:   12 month(s)  The format for your next appointment:   In Person  Provider:   You may see Yvonne Kendall, MD or one of the following Advanced Practice Providers on your designated Care Team:    Nicolasa Ducking, NP  Eula Listen, PA-C  Marisue Ivan, PA-C  [place], [place]

## 2020-01-05 DIAGNOSIS — B349 Viral infection, unspecified: Secondary | ICD-10-CM | POA: Diagnosis not present

## 2020-01-05 DIAGNOSIS — R059 Cough, unspecified: Secondary | ICD-10-CM | POA: Diagnosis not present

## 2020-01-10 DIAGNOSIS — B349 Viral infection, unspecified: Secondary | ICD-10-CM | POA: Diagnosis not present

## 2020-01-10 DIAGNOSIS — R059 Cough, unspecified: Secondary | ICD-10-CM | POA: Diagnosis not present

## 2020-01-10 DIAGNOSIS — R0981 Nasal congestion: Secondary | ICD-10-CM | POA: Diagnosis not present

## 2020-06-29 IMAGING — CR DG CHEST 2V
2 series · 2 of 2 positions shown · non-contrast
Comparison: 04/24/2012

CLINICAL DATA: Sudden onset of substernal chest pain radiating to
the left arm and neck. Shortness of breath.

EXAM:
CHEST - 2 VIEW

[chest pa]
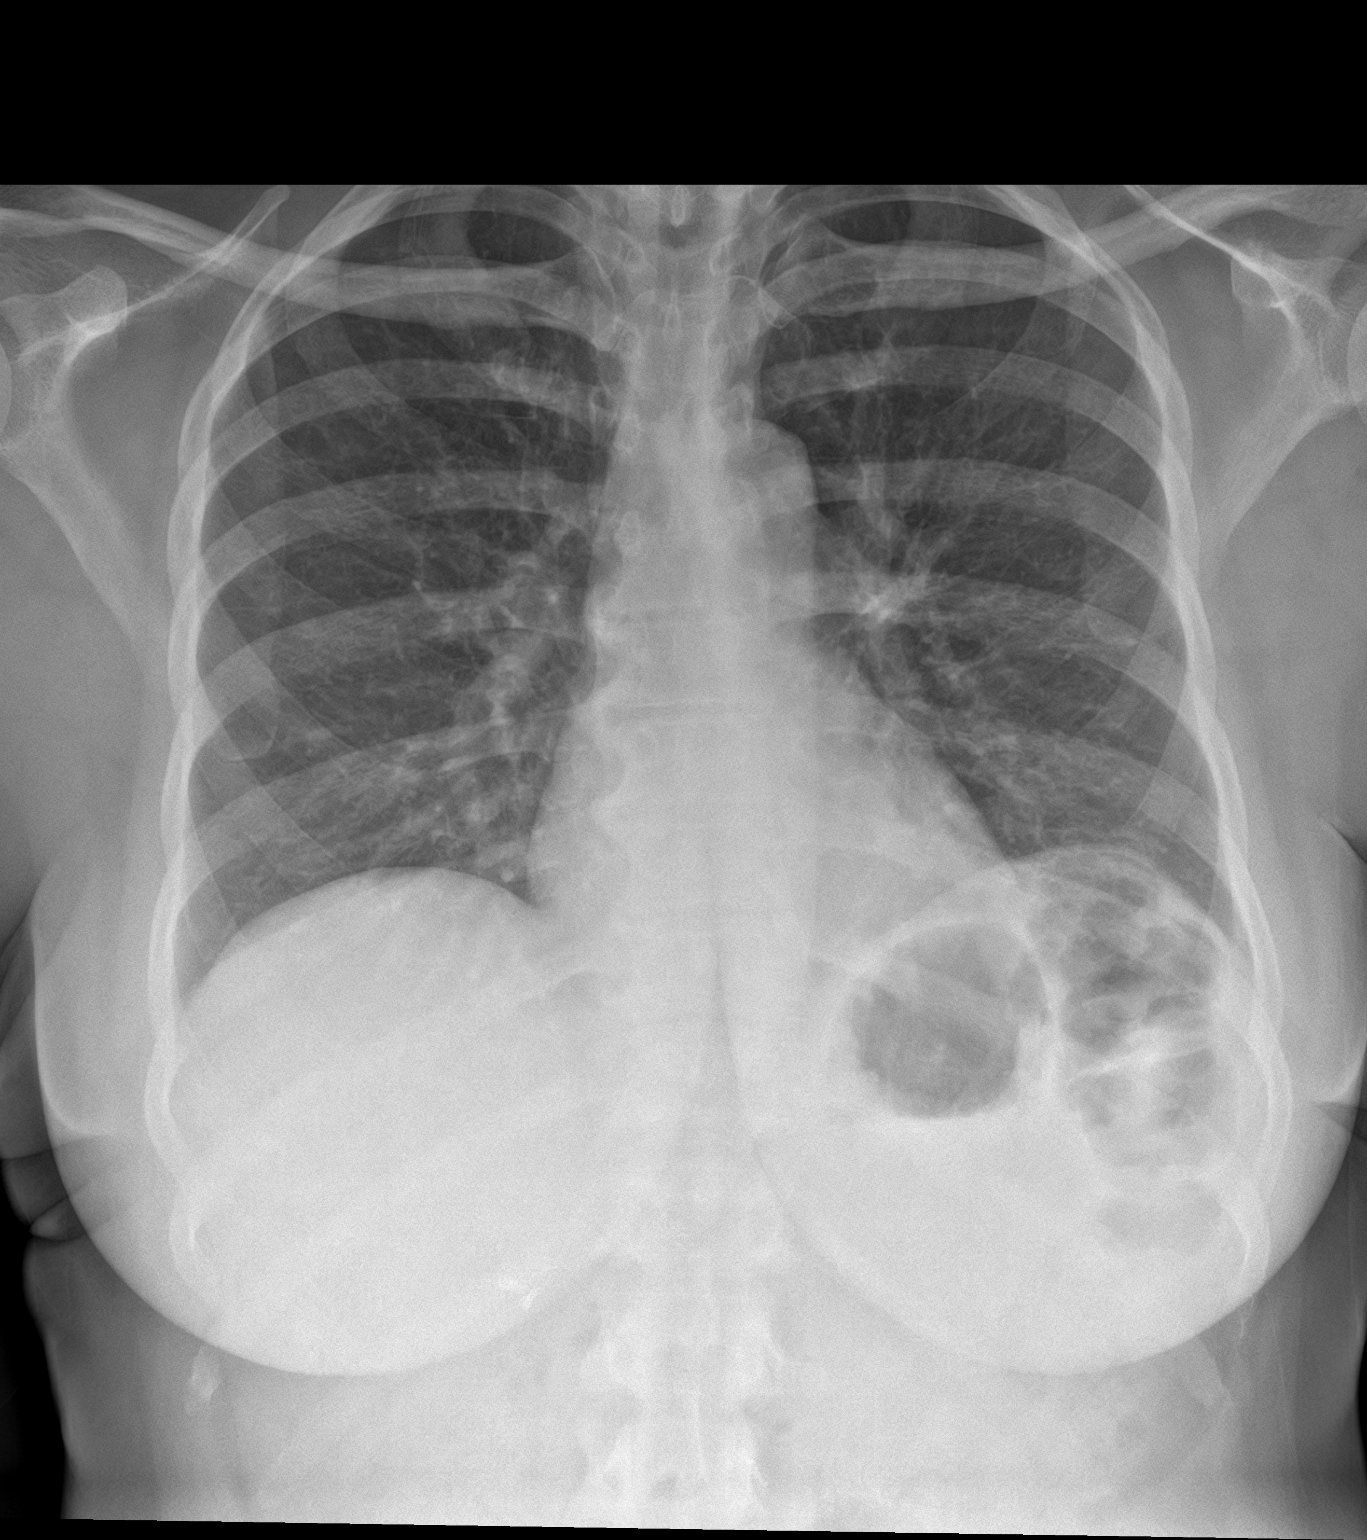

[chest lat]
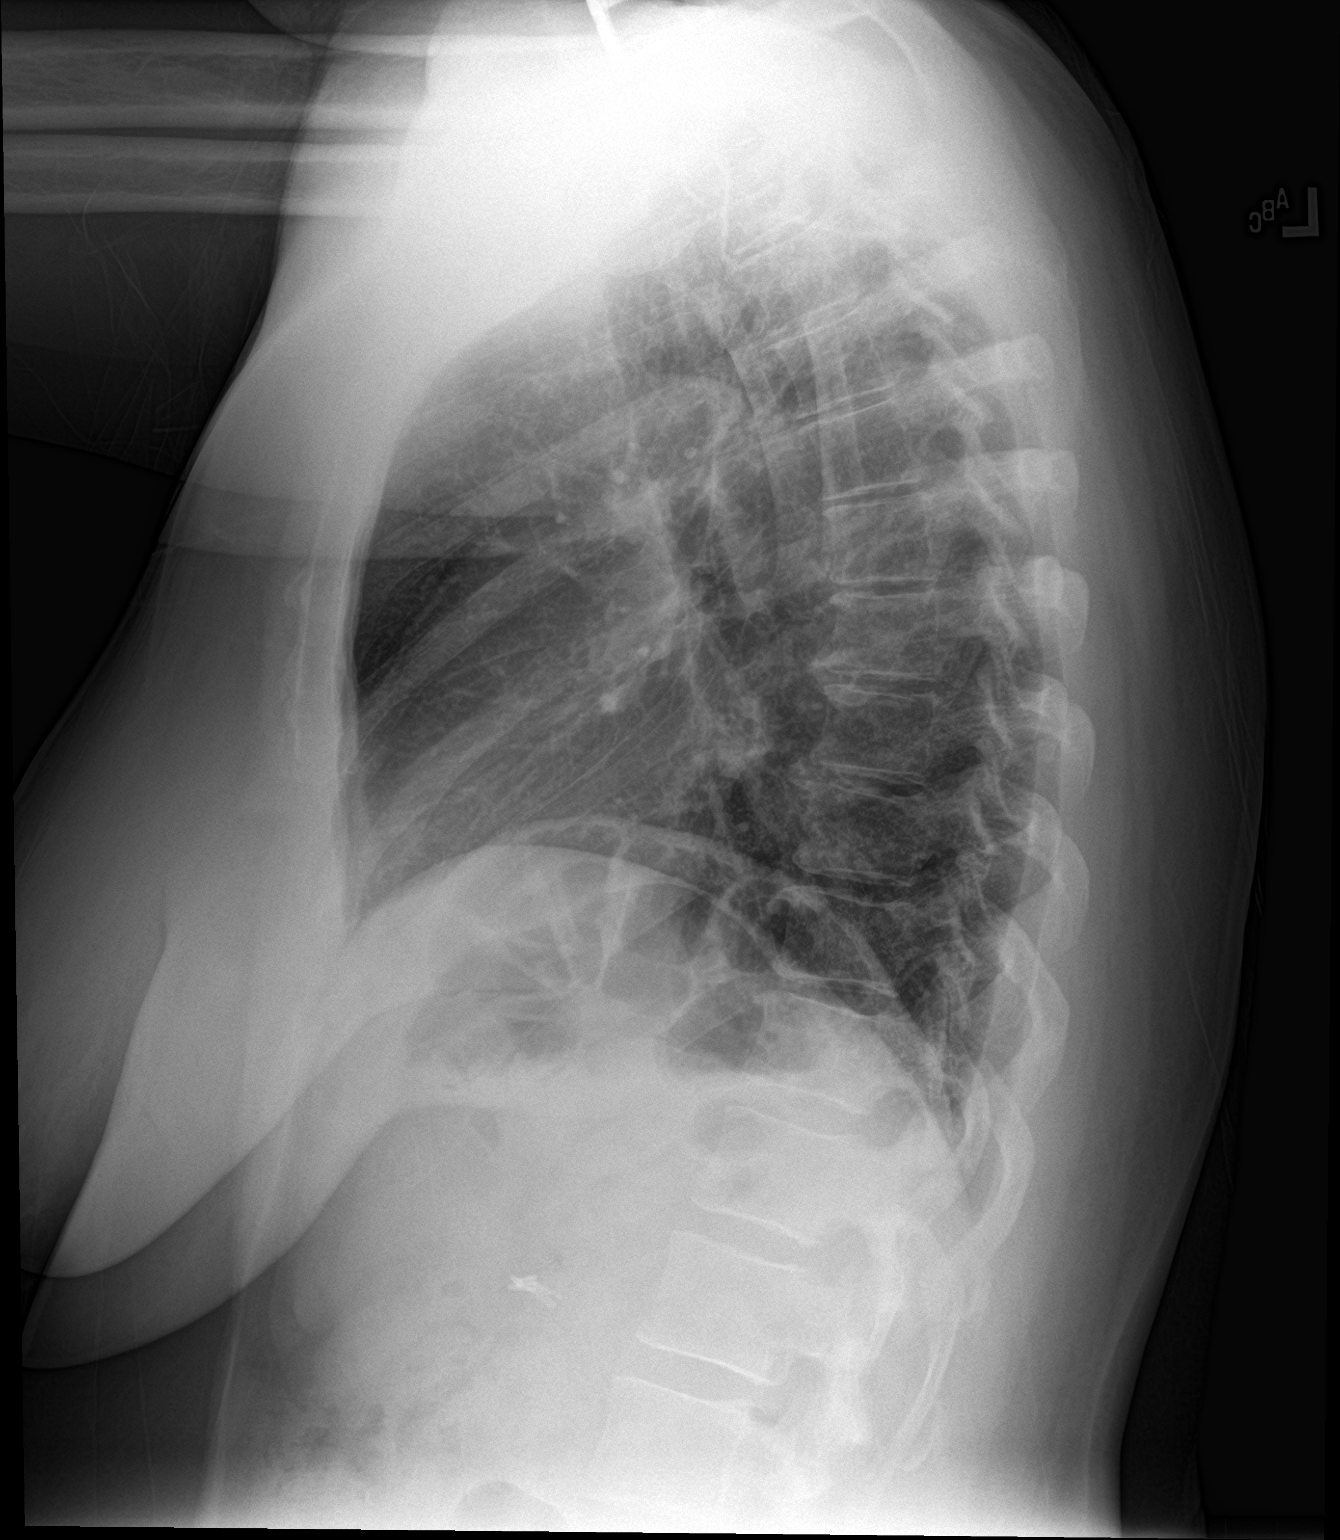

[2 of 2 positions shown; findings below may reference images not displayed]

FINDINGS: Interval lower cervical plate and screw fixator placement. Thoracic
spondylosis.

The lungs appear clear.  Cardiac and mediastinal contours normal.

No pleural effusion identified.
IMPRESSION: 1.  No active cardiopulmonary disease is radiographically apparent.
2. Lower cervical plate and screw fixator.
3. Thoracic spondylosis.

## 2020-07-08 IMAGING — CR DG CHEST 2V
1 series · 2 of 2 positions shown · non-contrast
Comparison: 04/09/2018, 04/07/2018

CLINICAL DATA: Dyspnea

EXAM:
CHEST - 2 VIEW

[Series 1: dg chest 2 view · 0.14mm/px · 2 of 2 slices shown]
[im 1/2]
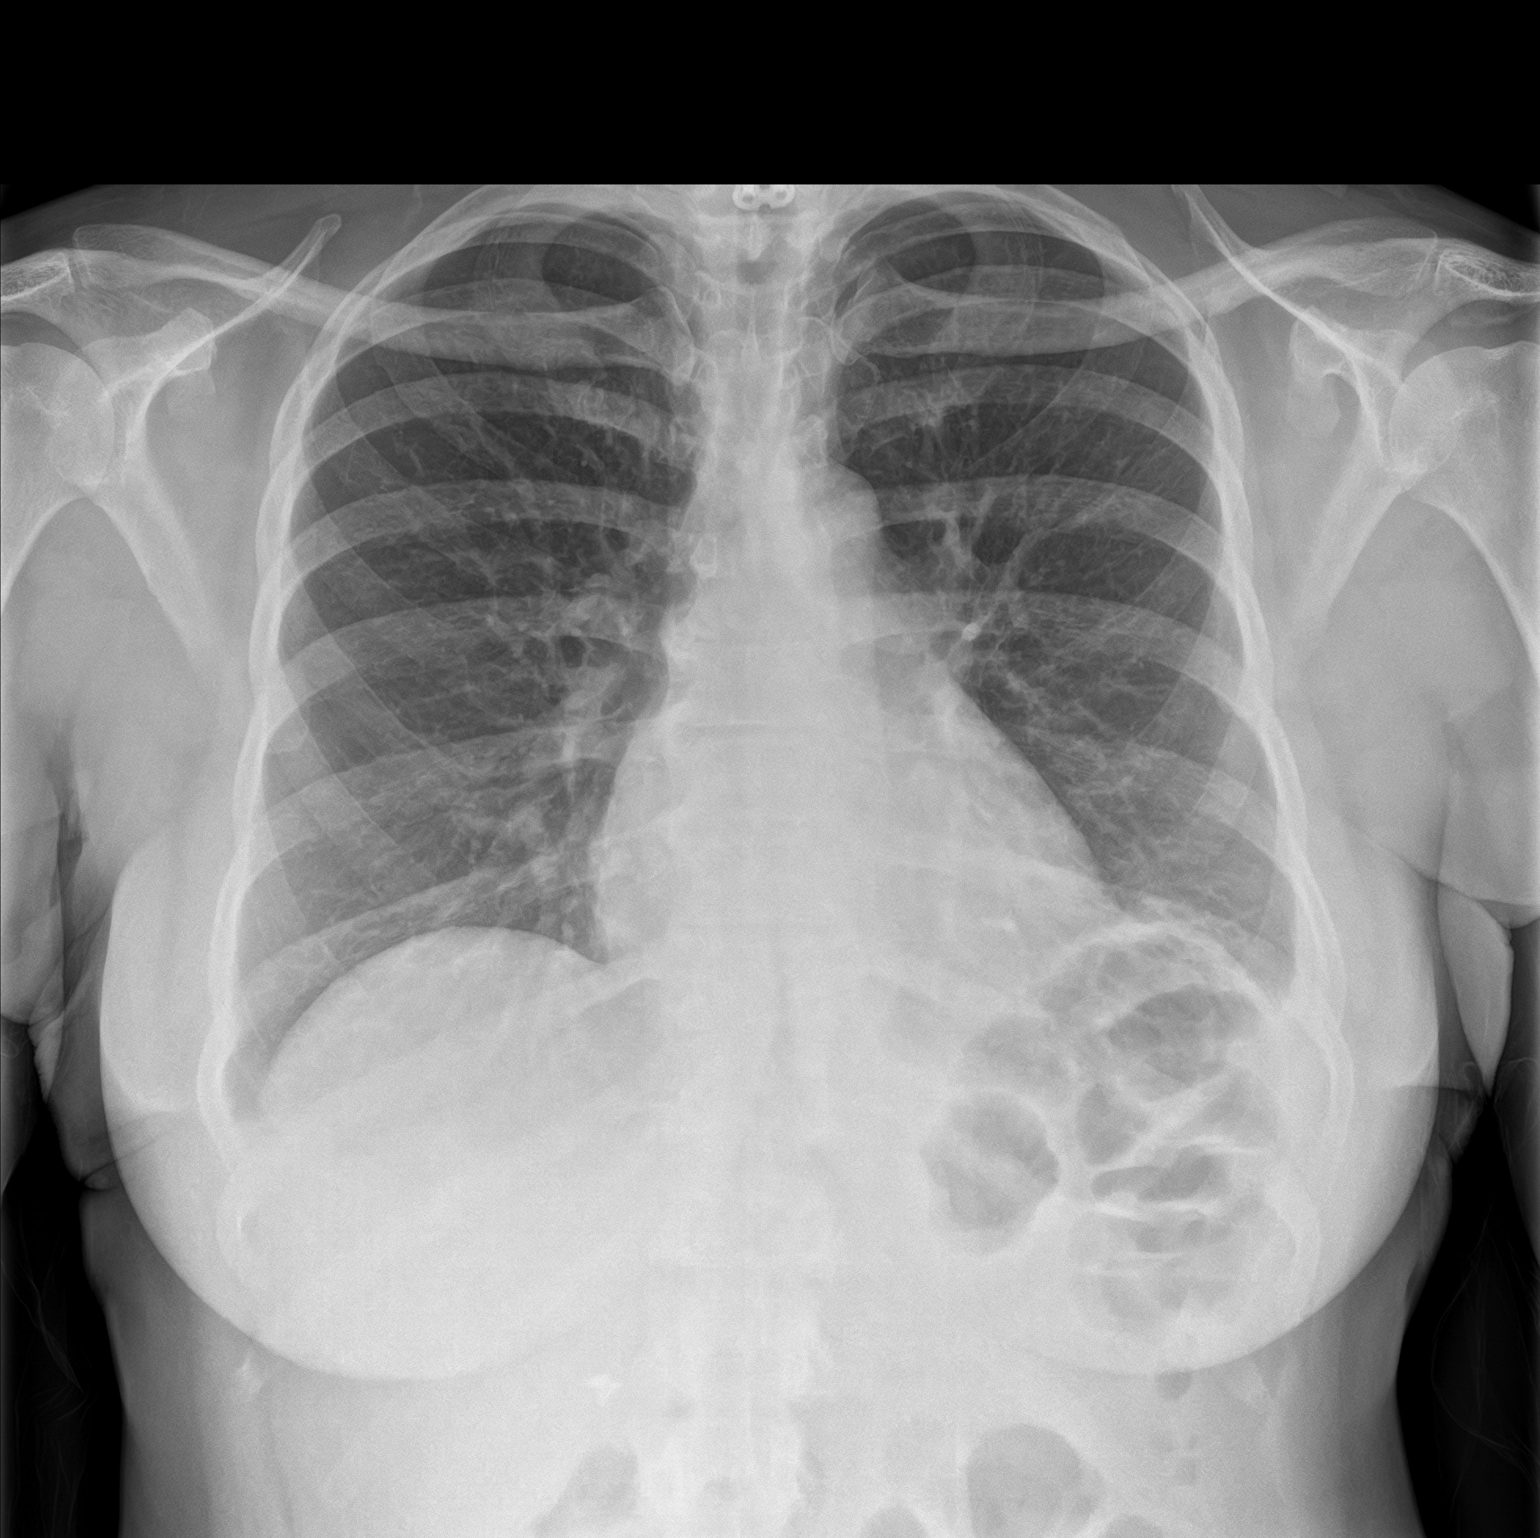
[im 2/2]
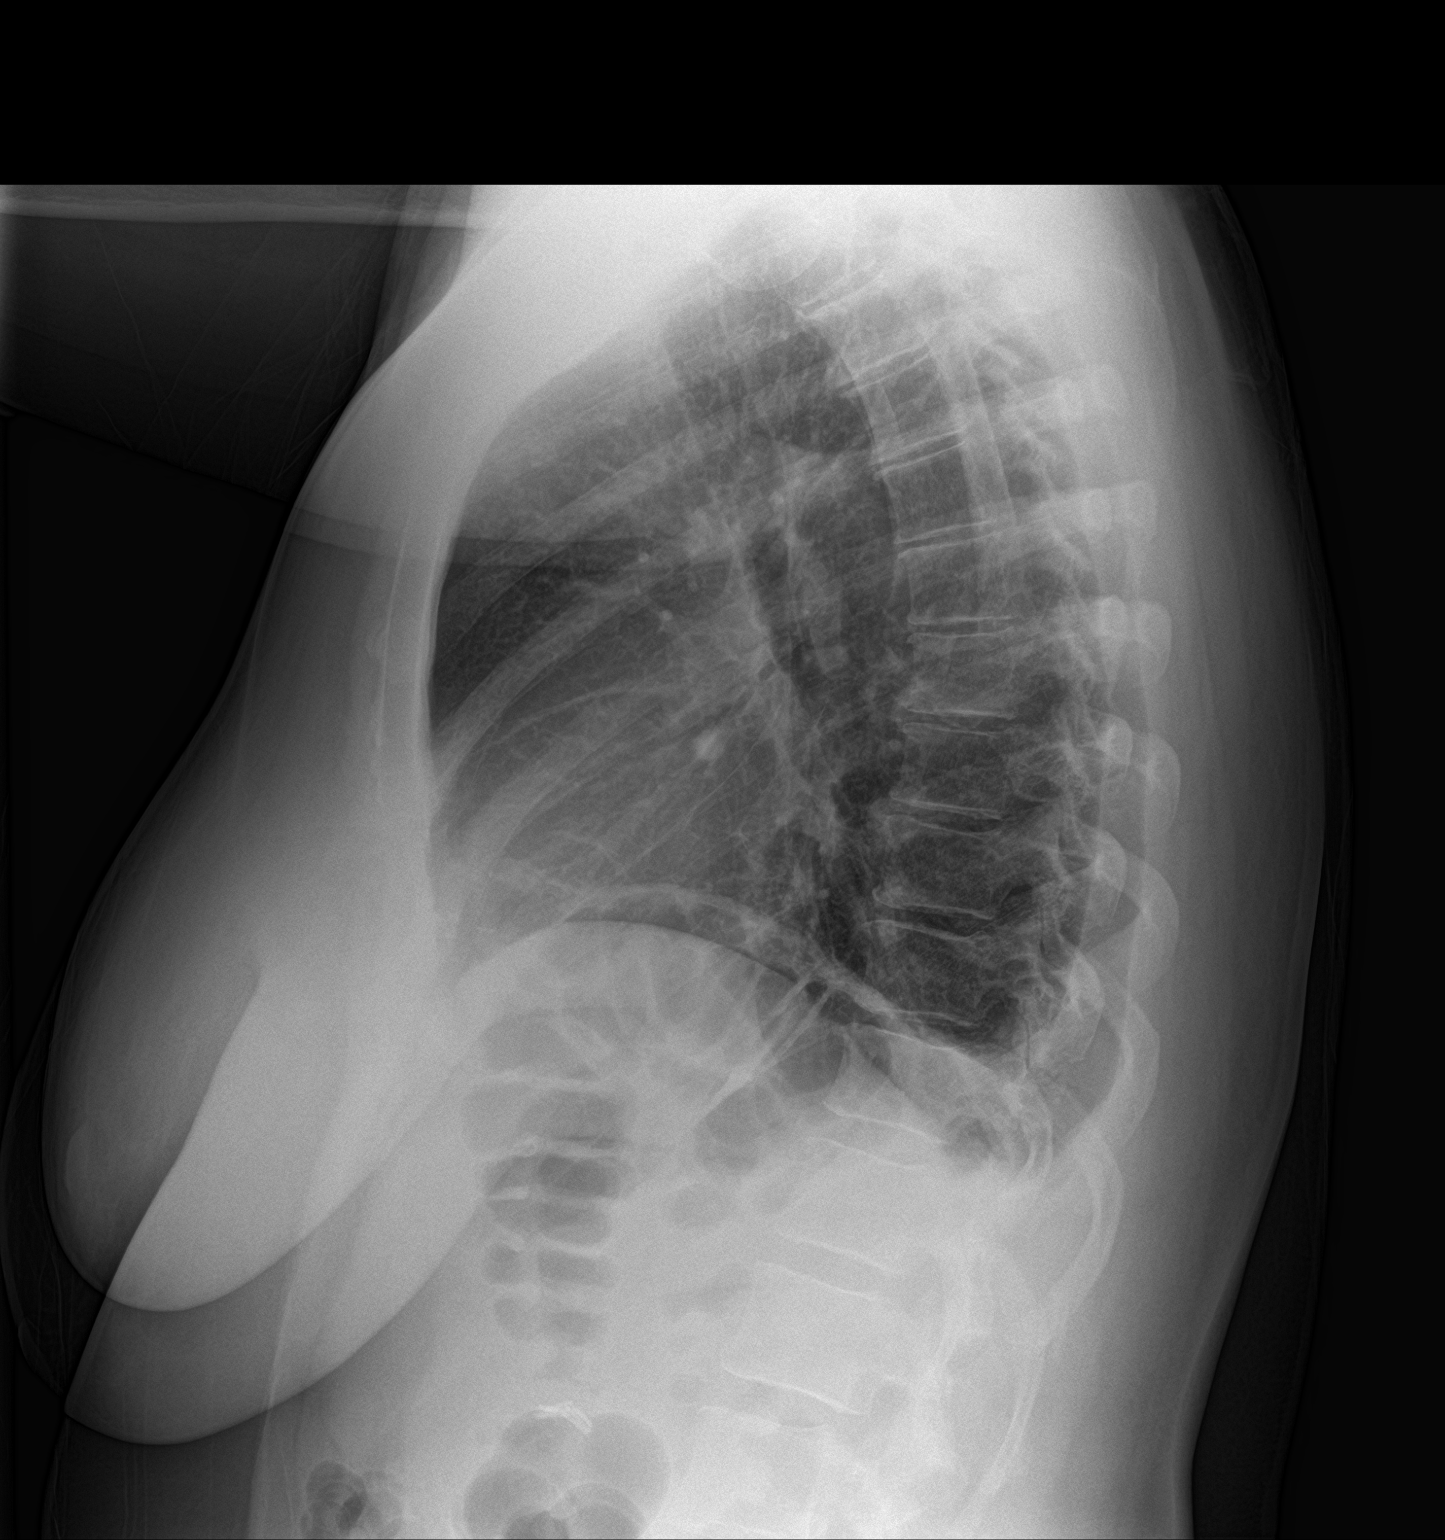

[2 of 2 positions shown; findings below may reference images not displayed]

FINDINGS: Streaky atelectasis at the left base with tiny pleural effusions.
Normal heart size. No pneumothorax. Postsurgical changes in the
cervical spine.
IMPRESSION: Small pleural effusion with streaky atelectasis or minimal
infiltrate at the left base.

## 2020-11-24 NOTE — Progress Notes (Deleted)
Cardiology Office Note  Date:  11/24/2020   ID:  Caroline Garcia, DOB: 1961-01-28, MRN: [id number]  PCP:  Patient, No Pcp Per (Inactive)   No chief complaint on file.   HPI:  Caroline Garcia is a 60 y.o. female with a PMHx of: Myopericarditis Chest pain Who presents to the office today for chest pain/myopericarditis  LOV  04/2018     hospitalization February 2020 myopericarditis elevated troponin, underwent cardiac catheterization showing no significant coronary disease NSAIDs with colchicine was recommended On follow-up visit May 07, 2018 symptoms had resolved, NSAIDs held" continued on colchicine Symptoms have since recurred, having severe chest discomfort   sharp chest pain for 4 days.  Reports that she tried Imdur but this caused headache and did not help her chest pain  Previously took sublingual nitroglycerin with very short relief that did not sustain more than several minutes sharp pain in her left arm. Strong positional nature of her pain  states that she cannot lay down, which may be due to inflammation. Whenever she gets up and starts walking, it starts irritating the chest pain.   Today's Blood pressure 140/80 Total Chol 145/ LDL 78 HBA1C 5.5 CR 0.88 Glucose 96  EKG personally reviewed by myself on todays visit Shows normal sinus rhythm. 62 bpm. T wave abnormality, diffuse.   OTHER PAST MEDICAL HISTORY REVIEWED BY ME FOR TODAY'S VISIT: The most recent echo was on 05/03/2018 showed an ejection fraction of 60 - 65% and there was no evidence of pericardial effusion.  She had an echocardiogram on 04/10/2018 showed an ejection fraction of 60 - 65% and the inferior vena cava was dilated in size with >50% respiratory variability.  She had a cardiac catheterization on 04/09/2018 showed no angiographically significant coronary artery disease to explain patient's chest pain, EKG changes, and elevated troponin. There was also normal left ventricular systolic function and  filling pressure.   PMH:   has a past medical history of History of echocardiogram, Myocarditis (HCC), PONV (postoperative nausea and vomiting), and UTI (urinary tract infection).  PSH:    Past Surgical History:  Procedure Laterality Date   ANTERIOR CERVICAL DECOMP/DISCECTOMY FUSION N/A 04/29/2012   Procedure: C5-6, C6-7 Anterior Cervical Discectomy and Fusion, Allograft, Plate;  Surgeon: Eldred Manges, MD;  Location: [facility] OR;  Service: Orthopedics;  Laterality: N/A;  C5-6, C6-7 Anterior Cervical Discectomy and Fusion, Allograft and Plate   CHOLECYSTECTOMY     LEFT HEART CATH AND CORONARY ANGIOGRAPHY N/A 04/09/2018   Procedure: LEFT HEART CATH AND CORONARY ANGIOGRAPHY;  Surgeon: Yvonne Kendall, MD;  Location: [facility] INVASIVE CV LAB;  Service: Cardiovascular;  Laterality: N/A;    Current Outpatient Medications  Medication Sig Dispense Refill   nitroGLYCERIN (NITROSTAT) 0.4 MG SL tablet Place 1 tablet (0.4 mg total) under the tongue every 5 (five) minutes as needed for chest pain (maximum of 3 doses.). 30 tablet 3   No current facility-administered medications for this visit.     ALLERGIES:   Patient has no known allergies.   SOCIAL HISTORY:  The patient  reports that she has never smoked. She has never used smokeless tobacco. She reports that she does not drink alcohol and does not use drugs.   FAMILY HISTORY:   family history includes Diabetes in her maternal grandmother; Heart disease in her maternal grandmother; Heart failure in her father; Hyperlipidemia in her father and maternal grandmother; Hypertension in her father and maternal grandmother.    REVIEW OF SYSTEMS: Review of Systems  Constitutional: Negative.  Eyes: Negative.   Respiratory: Negative.    Cardiovascular:  Positive for chest pain (radiates to left arm).  Gastrointestinal: Negative.   Genitourinary: Negative.   Musculoskeletal: Negative.   Neurological:  Tingling: left arm.  Psychiatric/Behavioral: Negative.     All other systems reviewed and are negative.  PHYSICAL EXAM: VS:  There were no vitals taken for this visit. , BMI There is no height or weight on file to calculate BMI.  GEN: Well nourished, well developed, in no acute distress HEENT: normal Neck: no JVD, carotid bruits, or masses Cardiac: RRR; no murmurs, rubs, or gallops,no edema  Respiratory:  clear to auscultation bilaterally, normal work of breathing GI: soft, nontender, nondistended, + BS MS: no deformity or atrophy Skin: warm and dry, no rash Neuro:  Strength and sensation are intact Psych: euthymic mood, full affect   RECENT LABS: No results found for requested labs within last 8760 hours.    LIPID PANEL: Lab Results  Component Value Date   CHOL 145 04/10/2018   HDL 50 04/10/2018   LDLCALC 78 04/10/2018   TRIG 84 04/10/2018      WEIGHT: Wt Readings from Last 3 Encounters:  11/26/19 198 lb 8 oz (90 kg)  11/13/18 191 lb (86.6 kg)  07/12/18 181 lb (82.1 kg)       ASSESSMENT AND PLAN:  Acute myocarditis, unspecified myocarditis type Symptoms have returned after holding NSAIDs Recommend she restart ibuprofen 600 mg 3 times daily and continue colchicine twice daily After 2 weeks if symptoms have dramatically improve with then decrease ibuprofen down to twice daily with colchicine twice daily for several more weeks -Suggested to her that she contact her office as she slowly adjust her medication Will need to do a slow wean off the NSAIDs and continue colchicine This process may take many weeks to months given recurrence of her symptoms  If she has recurrence of her symptoms, we will go back up on the dose of the NSAIDs and consider referral to rheumatology  Chest pain, unspecified type  Secondary to inflammatory process, myopericarditis Work-up in the hospital, no further ischemic work-up needed We will stop Imdur as this is causing a headache with no pain relief Less likely spasm  Disposition:   F/U  2  months  Total encounter time more than 25 minutes. Greater than 50% was spent in counseling and coordination of care with the patient.   No orders of the defined types were placed in this encounter.   I, Jesus Reyes am acting as a Neurosurgeon for Caroline Garcia, M.D., Ph.D.  I, Caroline Garcia, M.D. Ph.D., have reviewed the above documentation for accuracy and completeness, and I agree with the above.   Signed, Dossie Arbour, M.D., Ph.D. 11/24/2020  [facility] [place], [place] [phone]

## 2020-11-26 ENCOUNTER — Ambulatory Visit: Payer: BLUE CROSS/BLUE SHIELD | Admitting: Physician Assistant

## 2020-11-26 ENCOUNTER — Encounter: Payer: Self-pay | Admitting: Physician Assistant

## 2020-11-26 ENCOUNTER — Other Ambulatory Visit: Payer: Self-pay

## 2020-11-26 ENCOUNTER — Ambulatory Visit: Payer: BLUE CROSS/BLUE SHIELD | Admitting: Cardiovascular Disease

## 2020-11-26 VITALS — BP 122/82 | HR 68 | Ht 67.5 in | Wt 199.2 lb

## 2020-11-26 DIAGNOSIS — I319 Disease of pericardium, unspecified: Secondary | ICD-10-CM

## 2020-11-26 DIAGNOSIS — I409 Acute myocarditis, unspecified: Secondary | ICD-10-CM

## 2020-11-26 NOTE — Progress Notes (Signed)
Cardiology Office Note    Date:  11/26/2020   ID:  Caroline Garcia, DOB March 03, 1960, MRN [id number]  PCP:  Patient, No Pcp Per (Inactive)  Cardiologist:  Nelva Bush, MD  Electrophysiologist:  None   Chief Complaint: Follow-up  History of Present Illness:   Caroline Garcia is a 60 y.o. female with history of possible myocarditis with hospital admission in 03/2018 and COVID in 01/2020 who presents for follow-up.  She was seen in the emergency department on 04/07/2018 in the setting of chest and left arm pain and initially diagnosed with shingles.  Given persistent symptoms, she returned to the ED on 04/09/2018 and was found to have lateral ST segment elevation with an initial troponin of 7.9, ultimately peaking at 15.66.  Chest pain and EKG changes subsequently improved spontaneously.  She underwent diagnostic cardiac cath which showed no significant CAD as outlined below.  Echo showed normal LV systolic function.  There was concern for myocarditis versus coronary vasospasm.  She was discharged home on colchicine.  In follow-up on 04/16/2018 she reported ongoing chest discomfort that was worse when laying flat and with deep inspiration.  Follow-up echo in 04/2018 showed normal LV systolic function without evidence of pericardial effusion.  Colchicine was weaned off after approximately 6 months of therapy.  She was last seen in the office in 10/2019 and was doing relatively well.  She did notice a few transient episodes of vague chest discomfort when pushing herself at the gym.  She noted mild dyspnea and minor sharpness in her chest that would resolve within about a minute.  Symptoms were not felt to be cardiac in etiology.  She comes in doing well from a cardiac perspective.  She did test positive for COVID in 01/2020.  She did not require hospitalization.  She has been vaccinated and boosted.  She is considering obtaining the most recent booster as she is due.  No symptoms for recurrence of  myopericarditis.  She was mowing her lawn back in 08/2020 and became overheated with significant sweating and elevated heart rates in the 180s bpm.  She noticed fatigue for approximately 2 days after this.  Since then, she has been symptom-free.  No dizziness, presyncope, or syncope.  She has not needed any as needed nitro.  Outside of questions surrounding the COVID booster, she does not have any issues or concerns at this time.   Labs independently reviewed: 03/2018 - Hgb 12.0, PLT 386, potassium 4.0, BUN 9, serum creatinine 0.88, TC 145, TG 84, HDL 50, LDL 78, A1c 5.5, albumin 3.7, AST/ALT normal  Past Medical History:  Diagnosis Date   History of echocardiogram    a.  TTE 03/2018: EF of 60 to 65%, normal LV cavity size, normal LV diastolic function parameters, no regional wall motion abnormalities, normal RV systolic function with normal RV cavity size and normal RV wall thickness.  There were no significant valvular abnormalities; b. 04/2018 Echo: EF 60-65%, nl RV fxn. No pericardial effusion.   Myocarditis ([facility])    a.  LHC 03/2018: No significant CAD by angiography, normal LVEF, normal LV filling pressure   PONV (postoperative nausea and vomiting)    UTI (urinary tract infection)    hx    Past Surgical History:  Procedure Laterality Date   ANTERIOR CERVICAL DECOMP/DISCECTOMY FUSION N/A 04/29/2012   Procedure: C5-6, C6-7 Anterior Cervical Discectomy and Fusion, Allograft, Plate;  Surgeon: Marybelle Killings, MD;  Location: [facility];  Service: Orthopedics;  Laterality: N/A;  C5-6, C6-7 Anterior Cervical Discectomy and Fusion, Allograft and Plate   CHOLECYSTECTOMY     LEFT HEART CATH AND CORONARY ANGIOGRAPHY N/A 04/09/2018   Procedure: LEFT HEART CATH AND CORONARY ANGIOGRAPHY;  Surgeon: Nelva Bush, MD;  Location: [facility];  Service: Cardiovascular;  Laterality: N/A;    Current Medications: Current Meds  Medication Sig   nitroGLYCERIN (NITROSTAT) 0.4 MG SL tablet Place 1 tablet (0.4  mg total) under the tongue every 5 (five) minutes as needed for chest pain (maximum of 3 doses.).    Allergies:   Patient has no known allergies.   Social History   Socioeconomic History   Marital status: Married    Spouse name: Not on file   Number of children: Not on file   Years of education: Not on file   Highest education level: Not on file  Occupational History   Not on file  Tobacco Use   Smoking status: Never   Smokeless tobacco: Never  Substance and Sexual Activity   Alcohol use: No   Drug use: No   Sexual activity: Not on file  Other Topics Concern   Not on file  Social History Narrative   Not on file   Social Determinants of Health   Financial Resource Strain: Not on file  Food Insecurity: Not on file  Transportation Needs: Not on file  Physical Activity: Not on file  Stress: Not on file  Social Connections: Not on file     Family History:  The patient's family history includes Diabetes in her maternal grandmother; Heart disease in her maternal grandmother; Heart failure in her father; Hyperlipidemia in her father and maternal grandmother; Hypertension in her father and maternal grandmother.  ROS:   12-point review of systems is negative unless specified in the HPI.   EKGs/Labs/Other Studies Reviewed:    Studies reviewed were summarized above. The additional studies were reviewed today:  Limited echo 05/03/2018: 1. The left ventricle has normal systolic function, with an ejection  fraction of 60-65%. The cavity size was normal.   2. The right ventricle has normal systolc function. The cavity was  normal. There is no increase in right ventricular wall thickness.   3. Pericardium: There is no evidence of pericardial effusion.  __________  2D echo 04/10/2018: 1. The left ventricle has normal systolic function with an ejection  fraction of 60-65%. The cavity size was normal. Left ventricular diastolic  parameters were normal No evidence of left  ventricular regional wall  motion abnormalities.   2. No evidence of left ventricular regional wall motion abnormalities.   3. The right ventricle has normal systolic function. The cavity was  normal. There is no increase in right ventricular wall thickness.   4. The inferior vena cava was dilated in size with >50% respiratory  variability.  __________  [facility] 04/09/2018: Conclusions: No angiographically significant coronary artery disease to explain patient's chest pain, EKG changes, and elevated troponin.  Question vasospasm versus myopericarditis. Normal left ventricular systolic function and filling pressure.   Recommendations: Admit to hospitalist service. Obtain echo, CRP, ESR, and UDS. Trend troponin until it has peaked, then stop. Defer dual antiplatelet therapy pending aforementioned workup.    EKG:  EKG is ordered today.  The EKG ordered today demonstrates NSR, 68 bpm, normal axis, no acute ST-T changes  Recent Labs: No results found for requested labs within last 8760 hours.  Recent Lipid Panel    Component Value Date/Time   CHOL 145 04/10/2018 0622  TRIG 84 04/10/2018 0622   HDL 50 04/10/2018 0622   CHOLHDL 2.9 04/10/2018 0622   VLDL 17 04/10/2018 0622   LDLCALC 78 04/10/2018 0622    PHYSICAL EXAM:    VS:  BP 122/82   Pulse 68   Ht 5' 7.5" (1.715 m)   Wt 199 lb 3.2 oz (90.4 kg)   SpO2 98%   BMI 30.74 kg/m   BMI: Body mass index is 30.74 kg/m.  Physical Exam Vitals reviewed.  Constitutional:      Appearance: She is well-developed.  HENT:     Head: Normocephalic and atraumatic.  Eyes:     General:        Right eye: No discharge.        Left eye: No discharge.  Neck:     Vascular: No JVD.  Cardiovascular:     Rate and Rhythm: Normal rate and regular rhythm.     Pulses:          Posterior tibial pulses are 2+ on the right side and 2+ on the left side.     Heart sounds: Normal heart sounds, S1 normal and S2 normal. Heart sounds not distant. No  midsystolic click and no opening snap. No murmur heard.   No friction rub.  Pulmonary:     Effort: Pulmonary effort is normal. No respiratory distress.     Breath sounds: Normal breath sounds. No decreased breath sounds, wheezing or rales.  Chest:     Chest wall: No tenderness.  Abdominal:     General: There is no distension.     Palpations: Abdomen is soft.     Tenderness: There is no abdominal tenderness.  Musculoskeletal:     Cervical back: Normal range of motion.     Right lower leg: No edema.     Left lower leg: No edema.  Skin:    General: Skin is warm and dry.     Nails: There is no clubbing.  Neurological:     Mental Status: She is alert and oriented to person, place, and time.  Psychiatric:        Speech: Speech normal.        Behavior: Behavior normal.        Thought Content: Thought content normal.        Judgment: Judgment normal.    Wt Readings from Last 3 Encounters:  11/26/20 199 lb 3.2 oz (90.4 kg)  11/26/19 198 lb 8 oz (90 kg)  11/13/18 191 lb (86.6 kg)     ASSESSMENT & PLAN:   Myopericarditis: She is doing very well without any symptoms concerning for recurrence.  Per CDC advisory panel, the benefits of receiving the COVID booster outweigh the risks given that her myopericarditis was not associated with the COVID-19 vaccine.  Given this, it has been recommended she receive the COVID booster.  Should her episode of myopericarditis have been directly related to receiving the vaccine, subsequent doses would need to be deferred.  These scenarios were explained to her in detail today.  Given the benefits continue to outweigh risk, per CDC recommendations, she will proceed with the COVID booster.  Disposition: F/u with Dr. Saunders Revel or an APP in 12 months, sooner if needed.   Medication Adjustments/Labs and Tests Ordered: Current medicines are reviewed at length with the patient today.  Concerns regarding medicines are outlined above. Medication changes, Labs and Tests  ordered today are summarized above and listed in the Patient Instructions accessible in Encounters.  Signed, Christell Faith, PA-C 11/26/2020 4:01 PM     [address] [place], [place] [address] [phone]

## 2020-11-26 NOTE — Patient Instructions (Signed)
Medication Instructions:  Your physician recommends that you continue on your current medications as directed. Please refer to the Current Medication list given to you today.  *If you need a refill on your cardiac medications before your next appointment, please call your pharmacy*   Lab Work: None ordered  If you have labs (blood work) drawn today and your tests are completely normal, you will receive your results only by: MyChart Message (if you have MyChart) OR A paper copy in the mail If you have any lab test that is abnormal or we need to change your treatment, we will call you to review the results.   Testing/Procedures: None ordered   Follow-Up: At [facility], you and your health needs are our priority.  As part of our continuing mission to provide you with exceptional heart care, we have created designated Provider Care Teams.  These Care Teams include your primary Cardiologist (physician) and Advanced Practice Providers (APPs -  Physician Assistants and Nurse Practitioners) who all work together to provide you with the care you need, when you need it.  We recommend signing up for the patient portal called "MyChart".  Sign up information is provided on this After Visit Summary.  MyChart is used to connect with patients for Virtual Visits (Telemedicine).  Patients are able to view lab/test results, encounter notes, upcoming appointments, etc.  Non-urgent messages can be sent to your provider as well.   To learn more about what you can do with MyChart, go to [email or web address].    Your next appointment:   Your physician wants you to follow-up in: 1 year You will receive a reminder letter in the mail two months in advance. If you don't receive a letter, please call our office to schedule the follow-up appointment.   The format for your next appointment:   In Person  Provider:   You may see Yvonne Kendall, MD or one of the following Advanced Practice Providers on your  designated Care Team:   Nicolasa Ducking, NP Eula Listen, PA-C Marisue Ivan, PA-C Cadence Fransico Michael, [place]   Other Instructions N/A

## 2021-04-18 DIAGNOSIS — Z1211 Encounter for screening for malignant neoplasm of colon: Secondary | ICD-10-CM | POA: Diagnosis not present

## 2021-05-09 DIAGNOSIS — Z01419 Encounter for gynecological examination (general) (routine) without abnormal findings: Secondary | ICD-10-CM | POA: Diagnosis not present

## 2021-05-09 DIAGNOSIS — Z130 Encounter for screening for diseases of the blood and blood-forming organs and certain disorders involving the immune mechanism: Secondary | ICD-10-CM | POA: Diagnosis not present

## 2021-05-09 DIAGNOSIS — Z1389 Encounter for screening for other disorder: Secondary | ICD-10-CM | POA: Diagnosis not present

## 2021-05-09 DIAGNOSIS — Z6831 Body mass index (BMI) 31.0-31.9, adult: Secondary | ICD-10-CM | POA: Diagnosis not present

## 2021-05-19 DIAGNOSIS — K5730 Diverticulosis of large intestine without perforation or abscess without bleeding: Secondary | ICD-10-CM | POA: Diagnosis not present

## 2021-05-19 DIAGNOSIS — Z1211 Encounter for screening for malignant neoplasm of colon: Secondary | ICD-10-CM | POA: Diagnosis not present

## 2021-05-19 DIAGNOSIS — D123 Benign neoplasm of transverse colon: Secondary | ICD-10-CM | POA: Diagnosis not present

## 2021-05-19 DIAGNOSIS — K635 Polyp of colon: Secondary | ICD-10-CM | POA: Diagnosis not present

## 2021-05-19 DIAGNOSIS — D124 Benign neoplasm of descending colon: Secondary | ICD-10-CM | POA: Diagnosis not present

## 2021-05-19 DIAGNOSIS — D122 Benign neoplasm of ascending colon: Secondary | ICD-10-CM | POA: Diagnosis not present

## 2021-11-09 ENCOUNTER — Ambulatory Visit: Payer: BLUE CROSS/BLUE SHIELD | Admitting: Orthopaedic Surgery

## 2021-11-18 ENCOUNTER — Ambulatory Visit: Payer: BLUE CROSS/BLUE SHIELD | Admitting: Orthopaedic Surgery

## 2021-11-23 ENCOUNTER — Telehealth: Payer: Self-pay

## 2021-11-23 NOTE — Patient Outreach (Signed)
  Care Coordination   11/23/2021 Name: Caroline Garcia MRN: [id number] DOB: 01-24-1961   Care Coordination Outreach Attempts:  An unsuccessful telephone outreach was attempted today to offer the patient information about available care coordination services as a benefit of their health plan.   Follow Up Plan:  Additional outreach attempts will be made to offer the patient care coordination information and services.   Encounter Outcome:  No Answer  Care Coordination Interventions Activated:  No   Care Coordination Interventions:  No, not indicated    Quinn Plowman RN,BSN,CCM [place] [phone] direct line

## 2021-12-28 ENCOUNTER — Ambulatory Visit: Payer: BLUE CROSS/BLUE SHIELD | Admitting: Orthopaedic Surgery

## 2021-12-28 ENCOUNTER — Encounter: Payer: Self-pay | Admitting: Orthopaedic Surgery

## 2021-12-28 ENCOUNTER — Ambulatory Visit (INDEPENDENT_AMBULATORY_CARE_PROVIDER_SITE_OTHER): Payer: BLUE CROSS/BLUE SHIELD

## 2021-12-28 VITALS — BP 146/88 | HR 67 | Ht 67.0 in | Wt 172.3 lb

## 2021-12-28 DIAGNOSIS — M79605 Pain in left leg: Secondary | ICD-10-CM

## 2021-12-28 NOTE — Progress Notes (Signed)
Office Visit Note   Patient: Caroline Garcia           Date of Birth: [birth year]           MRN: [id number] Visit Date: 12/28/2021              Requested by: No referring provider defined for this encounter. PCP: Patient, No Pcp Per   Assessment & Plan: Visit Diagnoses:  1. Pain in left leg     Plan: Patient will work on a walking program continue the ibuprofen.  Recheck 6 weeks.  Follow-Up Instructions: Return in about 6 weeks (around 02/08/2022).   Orders:  Orders Placed This Encounter  Procedures   XR Lumbar Spine 2-3 Views   No orders of the defined types were placed in this encounter.     Procedures: No procedures performed   Clinical Data: No additional findings.   Subjective: Chief Complaint  Patient presents with   Left Leg - Pain    HPI 61 year old female seen with back and left leg pain from her buttocks down to her toes.  Pain seems to be worse in the left calf.  She notes that after she returned from a trip to [place].  Occasionally numbness and tingling.  She is used ibuprofen if the pain is intense and states it gives her some relief.  Previous history of two-level cervical fusion by May 2014 doing well.  No bowel bladder symptoms no fever or chills.  Review of Systems all other systems noncontributory HPI.   Objective: Vital Signs: BP (!) 146/88   Pulse 67   Ht 5\' 7"  (1.702 m)   Wt 172 lb 4.8 oz (78.2 kg)   BMI 26.99 kg/m   Physical Exam Constitutional:      Appearance: She is well-developed.  HENT:     Head: Normocephalic.     Right Ear: External ear normal.     Left Ear: External ear normal. There is no impacted cerumen.  Eyes:     Pupils: Pupils are equal, round, and reactive to light.  Neck:     Thyroid: No thyromegaly.     Trachea: No tracheal deviation.  Cardiovascular:     Rate and Rhythm: Normal rate.  Pulmonary:     Effort: Pulmonary effort is normal.  Abdominal:     Palpations: Abdomen is soft.  Musculoskeletal:      Cervical back: No rigidity.  Skin:    General: Skin is warm and dry.  Neurological:     Mental Status: She is alert and oriented to person, place, and time.  Psychiatric:        Behavior: Behavior normal.     Ortho Exam patient has some sciatic notch tenderness on the left minimal trochanteric bursal tenderness good hip range of motion.  Knee and ankle jerk are intact EHL anterior tib gastrocsoleus is strong.  Specialty Comments:  No specialty comments available.  Imaging: No results found.   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  TTE 03/2018: EF of 60 to 65%, normal LV cavity size, normal LV diastolic function parameters, no regional wall motion abnormalities, normal RV systolic function with normal RV cavity size and normal RV wall thickness.  There were no significant valvular abnormalities; b. 04/2018 Echo: EF 60-65%, nl RV fxn. No pericardial effusion.   Myocarditis ([facility])    a.  LHC 03/2018: No significant CAD by angiography, normal LVEF, normal LV filling pressure   PONV (postoperative nausea and vomiting)    UTI (urinary tract infection)    hx    Family History  Problem Relation Age of Onset   Hypertension Father    Hyperlipidemia Father    Heart failure Father    Heart disease Maternal Grandmother    Hyperlipidemia Maternal Grandmother    Hypertension Maternal Grandmother    Diabetes Maternal Grandmother     Past Surgical History:  Procedure Laterality Date   ANTERIOR CERVICAL DECOMP/DISCECTOMY FUSION N/A 04/29/2012   Procedure: C5-6, C6-7 Anterior Cervical Discectomy and Fusion, Allograft, Plate;  Surgeon: Marybelle Killings, MD;  Location: [facility];  Service: Orthopedics;  Laterality: N/A;  C5-6, C6-7 Anterior  Cervical Discectomy and Fusion, Allograft and Plate   CHOLECYSTECTOMY     LEFT HEART CATH AND CORONARY ANGIOGRAPHY N/A 04/09/2018   Procedure: LEFT HEART CATH AND CORONARY ANGIOGRAPHY;  Surgeon: Nelva Bush, MD;  Location: [facility];  Service: Cardiovascular;  Laterality: N/A;   Social History   Occupational History   Not on file  Tobacco Use   Smoking status: Never   Smokeless tobacco: Never  Substance and Sexual Activity   Alcohol use: No   Drug use: No   Sexual activity: Not on file

## 2022-01-11 DIAGNOSIS — M791 Myalgia, unspecified site: Secondary | ICD-10-CM | POA: Diagnosis not present

## 2022-02-07 ENCOUNTER — Ambulatory Visit: Payer: BLUE CROSS/BLUE SHIELD | Admitting: Orthopaedic Surgery

## 2022-02-07 DIAGNOSIS — M5416 Radiculopathy, lumbar region: Secondary | ICD-10-CM | POA: Diagnosis not present

## 2022-02-07 DIAGNOSIS — Z981 Arthrodesis status: Secondary | ICD-10-CM

## 2022-02-07 NOTE — Progress Notes (Signed)
Office Visit Note   Patient: Caroline Garcia           Date of Birth: [birth year]           MRN: [id number] Visit Date: 02/07/2022              Requested by: No referring provider defined for this encounter. PCP: Patient, No Pcp Per   Assessment & Plan: Visit Diagnoses:  1. Pain, radicular, lumbar   2. History of fusion of cervical spine     Plan: She continues to have ongoing problems or increase in symptoms she can call about diagnostic imaging.  We discussed physical therapy referral.  Currently her symptoms are not severe and we will recheck her in 6 months.  If she gets increasing symptoms she can call back to the office.  Follow-Up Instructions: Return in about 6 months (around 08/09/2022).   Orders:  No orders of the defined types were placed in this encounter.  No orders of the defined types were placed in this encounter.     Procedures: No procedures performed   Clinical Data: No additional findings.   Subjective: Chief Complaint  Patient presents with   Left Leg - Follow-up    HPI 61 year old female post two-level cervical fusion C5-6 C6-7 2014 by me is seen with ongoing back and left leg pain.  She has been on a walking program taking ibuprofen and has not gotten any relief.  She has had increased problems since she did a lot of walking when she was in [place].  She has some intermittent numbness and tingling in the left leg only not on the right but not persistent.  Discomfort with bending.  No fever chills no bowel or bladder symptoms.  Review of Systems all other systems noncontributory to HPI.  Past history of cholecystectomy also cervical fusion.   Objective: Vital Signs: There were no vitals taken for this visit.  Physical Exam Constitutional:      Appearance: She is well-developed.  HENT:     Head: Normocephalic.     Right Ear: External ear normal.     Left Ear: External ear normal. There is no impacted cerumen.  Eyes:     Pupils: Pupils  are equal, round, and reactive to light.  Neck:     Thyroid: No thyromegaly.     Trachea: No tracheal deviation.  Cardiovascular:     Rate and Rhythm: Normal rate.  Pulmonary:     Effort: Pulmonary effort is normal.  Abdominal:     Palpations: Abdomen is soft.  Musculoskeletal:     Cervical back: No rigidity.  Skin:    General: Skin is warm and dry.  Neurological:     Mental Status: She is alert and oriented to person, place, and time.  Psychiatric:        Behavior: Behavior normal.     Ortho Exam well-healed cervical incision negative Spurling good cervical range of motion.  No brachial plexus tenderness.  Upper extremity reflexes are 2+ negative logroll the hips.  Mild tenderness over the trochanteric bursa over the left none on the right.  Minimal discomfort with straight leg raising 90 degrees.  Negative popliteal compression test.  Anterior tib EHL peroneals gastrocsoleus quads hamstrings are strong.  Specialty Comments:  No specialty comments available.  Imaging: No results found.   PMFS History: Patient Active Problem List   Diagnosis Date Noted   Pain, radicular, lumbar 02/10/2022   History of fusion of cervical spine  02/10/2022   Acute myocarditis 05/16/2018   Chest pain 04/09/2018   Myopericarditis 04/09/2018   Past Medical History:  Diagnosis Date   History of echocardiogram    a.  TTE 03/2018: EF of 60 to 65%, normal LV cavity size, normal LV diastolic function parameters, no regional wall motion abnormalities, normal RV systolic function with normal RV cavity size and normal RV wall thickness.  There were no significant valvular abnormalities; b. 04/2018 Echo: EF 60-65%, nl RV fxn. No pericardial effusion.   Myocarditis ([facility])    a.  LHC 03/2018: No significant CAD by angiography, normal LVEF, normal LV filling pressure   PONV (postoperative nausea and vomiting)    UTI (urinary tract infection)    hx    Family History  Problem Relation Age of Onset    Hypertension Father    Hyperlipidemia Father    Heart failure Father    Heart disease Maternal Grandmother    Hyperlipidemia Maternal Grandmother    Hypertension Maternal Grandmother    Diabetes Maternal Grandmother     Past Surgical History:  Procedure Laterality Date   ANTERIOR CERVICAL DECOMP/DISCECTOMY FUSION N/A 04/29/2012   Procedure: C5-6, C6-7 Anterior Cervical Discectomy and Fusion, Allograft, Plate;  Surgeon: Eldred Manges, MD;  Location: [facility];  Service: Orthopedics;  Laterality: N/A;  C5-6, C6-7 Anterior Cervical Discectomy and Fusion, Allograft and Plate   CHOLECYSTECTOMY     LEFT HEART CATH AND CORONARY ANGIOGRAPHY N/A 04/09/2018   Procedure: LEFT HEART CATH AND CORONARY ANGIOGRAPHY;  Surgeon: Yvonne Kendall, MD;  Location: [facility];  Service: Cardiovascular;  Laterality: N/A;   Social History   Occupational History   Not on file  Tobacco Use   Smoking status: Never   Smokeless tobacco: Never  Substance and Sexual Activity   Alcohol use: No   Drug use: No   Sexual activity: Not on file

## 2022-02-08 ENCOUNTER — Ambulatory Visit: Payer: BLUE CROSS/BLUE SHIELD | Admitting: Orthopaedic Surgery

## 2022-02-10 DIAGNOSIS — Z981 Arthrodesis status: Secondary | ICD-10-CM | POA: Insufficient documentation

## 2022-02-10 DIAGNOSIS — M5416 Radiculopathy, lumbar region: Secondary | ICD-10-CM | POA: Insufficient documentation

## 2022-03-15 NOTE — Progress Notes (Signed)
Follow-up Outpatient Visit Date: 03/16/2022  Primary Care Provider: Patient, No Pcp Per No address on file  Chief Complaint: Follow-up myopericarditis  HPI:  Caroline Garcia is a 62 y.o. female with history of suspected myopericarditis in 03/2018, who presents for follow-up of myopericarditis.  She was last seen in our office in 10/2020 by Christell Faith, PA, which time she was doing well from a cardiac perspective without symptoms to suggest recurrent myopericarditis.  No further medication changes or additional testing were pursued.  Today, Caroline Garcia reports that she has been feeling quite well.  She had a "weird" sensation that came and went over the course of a few days about a month ago.  It was not reminiscent of what she experienced at the time of myopericarditis in 2020.  She has not had any frank chest pain nor shortness of breath, palpitations, lightheadedness, or edema.  The weird sensation occurred around the time that she contracted COVID-19 in December.  She has felt well the last few weeks and is exercising at least 4 to 5 days a week.  --------------------------------------------------------------------------------------------------  Past Medical History:  Diagnosis Date   History of echocardiogram    a.  TTE 03/2018: EF of 60 to 65%, normal LV cavity size, normal LV diastolic function parameters, no regional wall motion abnormalities, normal RV systolic function with normal RV cavity size and normal RV wall thickness.  There were no significant valvular abnormalities; b. 04/2018 Echo: EF 60-65%, nl RV fxn. No pericardial effusion.   Myocarditis ([place])    a.  LHC 03/2018: No significant CAD by angiography, normal LVEF, normal LV filling pressure   PONV (postoperative nausea and vomiting)    UTI (urinary tract infection)    hx   Past Surgical History:  Procedure Laterality Date   ANTERIOR CERVICAL DECOMP/DISCECTOMY FUSION N/A 04/29/2012   Procedure: C5-6, C6-7 Anterior Cervical  Discectomy and Fusion, Allograft, Plate;  Surgeon: Marybelle Killings, MD;  Location: [facility];  Service: Orthopedics;  Laterality: N/A;  C5-6, C6-7 Anterior Cervical Discectomy and Fusion, Allograft and Plate   CHOLECYSTECTOMY     LEFT HEART CATH AND CORONARY ANGIOGRAPHY N/A 04/09/2018   Procedure: LEFT HEART CATH AND CORONARY ANGIOGRAPHY;  Surgeon: Nelva Bush, MD;  Location: [facility];  Service: Cardiovascular;  Laterality: N/A;    Current Meds  Medication Sig   nitroGLYCERIN (NITROSTAT) 0.4 MG SL tablet Place 1 tablet (0.4 mg total) under the tongue every 5 (five) minutes as needed for chest pain (maximum of 3 doses.).    Allergies: Patient has no known allergies.  Social History   Tobacco Use   Smoking status: Never   Smokeless tobacco: Never  Vaping Use   Vaping Use: Never used  Substance Use Topics   Alcohol use: No   Drug use: No    Family History  Problem Relation Age of Onset   Hypertension Father    Hyperlipidemia Father    Heart failure Father    Heart disease Maternal Grandmother    Hyperlipidemia Maternal Grandmother    Hypertension Maternal Grandmother    Diabetes Maternal Grandmother     Review of Systems: A 12-system review of systems was performed and was negative except as noted in the HPI.  --------------------------------------------------------------------------------------------------  Physical Exam: BP (!) 126/90 (BP Location: Left Arm, Patient Position: Sitting, Cuff Size: Normal)   Pulse 63   Ht 5\' 7"  (1.702 m)   Wt 174 lb (78.9 kg)   SpO2 99%   BMI  27.25 kg/m  Repeat BP: 130/80  General:  NAD. Neck: No JVD or HJR. Lungs: Clear to auscultation bilaterally without wheezes or crackles. Heart: Regular rate and rhythm without murmurs, rubs, or gallops. Abdomen: Soft, nontender, nondistended. Extremities: No lower extremity edema.  EKG: Normal sinus rhythm without abnormalities.  Lab Results  Component Value Date   WBC 9.3  04/16/2018   HGB 12.0 04/16/2018   HCT 37.4 04/16/2018   MCV 90.6 04/16/2018   PLT 386 04/16/2018    Lab Results  Component Value Date   NA 139 04/16/2018   K 4.0 04/16/2018   CL 106 04/16/2018   CO2 26 04/16/2018   BUN 9 04/16/2018   CREATININE 0.88 04/16/2018   GLUCOSE 96 04/16/2018   ALT 16 04/07/2018    Lab Results  Component Value Date   CHOL 145 04/10/2018   HDL 50 04/10/2018   LDLCALC 78 04/10/2018   TRIG 84 04/10/2018   CHOLHDL 2.9 04/10/2018    --------------------------------------------------------------------------------------------------  ASSESSMENT AND PLAN: History of myopericarditis: No symptoms to suggest recurrence.  I encouraged Caroline Garcia to remain active and to alert [place] if she has any recurrent chest pain or dyspnea.  Transient "weird" sensation in the setting of COVID-19 infection last month is nonspecific.  She does not have any continued symptoms or abnormalities on exam to suggest myopericarditis.  I suggested that she have a lipid panel done for routine screening at her next follow-up with her gynecologist, who also serves as her PCP.  Given absence of CAD and no further chest pain, I do not see need for her to continue carrying sublingual nitroglycerin.  Follow-up: Return to clinic in 1 year.  Nelva Bush, MD 03/16/2022 10:03 AM

## 2022-03-16 ENCOUNTER — Encounter: Payer: Self-pay | Admitting: Internal Medicine

## 2022-03-16 ENCOUNTER — Ambulatory Visit: Payer: BLUE CROSS/BLUE SHIELD | Attending: Internal Medicine | Admitting: Internal Medicine

## 2022-03-16 VITALS — BP 130/80 | HR 63 | Ht 67.0 in | Wt 174.0 lb

## 2022-03-16 DIAGNOSIS — I319 Disease of pericardium, unspecified: Secondary | ICD-10-CM | POA: Diagnosis not present

## 2022-03-16 NOTE — Patient Instructions (Addendum)
Medication Instructions:  Your physician recommends the following medication changes.  STOP TAKING: Nitroglycerin as needed  *If you need a refill on your cardiac medications before your next appointment, please call your pharmacy*   Lab Work: None ordered today   Testing/Procedures: None ordered today   Follow-Up: At [facility], you and your health needs are our priority.  As part of our continuing mission to provide you with exceptional heart care, we have created designated Provider Care Teams.  These Care Teams include your primary Cardiologist (physician) and Advanced Practice Providers (APPs -  Physician Assistants and Nurse Practitioners) who all work together to provide you with the care you need, when you need it.  We recommend signing up for the patient portal called "MyChart".  Sign up information is provided on this After Visit Summary.  MyChart is used to connect with patients for Virtual Visits (Telemedicine).  Patients are able to view lab/test results, encounter notes, upcoming appointments, etc.  Non-urgent messages can be sent to your provider as well.   To learn more about what you can do with MyChart, go to [email or web address].    Your next appointment:   1 year(s)  Provider:   You may see Nelva Bush, MD or one of the following Advanced Practice Providers on your designated Care Team:   Murray Hodgkins, NP Christell Faith, PA-C Cadence Kathlen Mody, PA-C Gerrie Nordmann, NP

## 2022-03-17 ENCOUNTER — Encounter: Payer: Self-pay | Admitting: Internal Medicine

## 2022-03-21 NOTE — Addendum Note (Signed)
Addended by: Raelene Bott, Cindra Austad L on: 03/21/2022 09:15 AM   Modules accepted: Orders

## 2022-04-27 ENCOUNTER — Telehealth: Payer: Self-pay | Admitting: Orthopaedic Surgery

## 2022-04-27 NOTE — Telephone Encounter (Signed)
Called pt 1X Dr Lorin Mercy will not be in office 6/11 and pt need to reschedule

## 2022-04-29 DIAGNOSIS — R6883 Chills (without fever): Secondary | ICD-10-CM | POA: Diagnosis not present

## 2022-04-29 DIAGNOSIS — J019 Acute sinusitis, unspecified: Secondary | ICD-10-CM | POA: Diagnosis not present

## 2022-04-29 DIAGNOSIS — J029 Acute pharyngitis, unspecified: Secondary | ICD-10-CM | POA: Diagnosis not present

## 2022-06-16 ENCOUNTER — Ambulatory Visit: Payer: BLUE CROSS/BLUE SHIELD | Admitting: Orthopaedic Surgery

## 2022-06-16 ENCOUNTER — Encounter: Payer: Self-pay | Admitting: Orthopaedic Surgery

## 2022-06-16 VITALS — BP 138/86 | HR 77 | Ht 67.0 in | Wt 174.0 lb

## 2022-06-16 DIAGNOSIS — Z981 Arthrodesis status: Secondary | ICD-10-CM

## 2022-06-16 DIAGNOSIS — M5416 Radiculopathy, lumbar region: Secondary | ICD-10-CM | POA: Diagnosis not present

## 2022-06-16 NOTE — Progress Notes (Unsigned)
   Office Visit Note   Patient: Caroline Garcia           Date of Birth: Mar 18, 1960           MRN: [id number] Visit Date: 06/16/2022              Requested by: No referring provider defined for this encounter. PCP: Patient, No Pcp Per   Assessment & Plan: Visit Diagnoses: No diagnosis found.  Plan: ***  Follow-Up Instructions: No follow-ups on file.   Orders:  No orders of the defined types were placed in this encounter.  No orders of the defined types were placed in this encounter.     Procedures: No procedures performed   Clinical Data: No additional findings.   Subjective: Chief Complaint  Patient presents with   Lower Back - Pain   Left Leg - Pain    HPI  Review of Systems   Objective: Vital Signs: BP 138/86   Pulse 77   Ht  (1.702 m)   Wt 174 lb (78.9 kg)   BMI 27.25 kg/m   Physical Exam  Ortho Exam  Specialty Comments:  No specialty comments available.  Imaging: No results found.   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  TTE 03/2018: EF of 60 to 65%, normal LV cavity size, normal LV diastolic function parameters, no regional wall motion abnormalities, normal RV systolic function with normal RV cavity size and normal RV wall thickness.  There were no significant valvular abnormalities; b. 04/2018 Echo: EF 60-65%, nl RV fxn. No pericardial effusion.   Myocarditis    a.  LHC 03/2018: No significant CAD by angiography, normal LVEF, normal LV filling pressure   PONV (postoperative nausea and vomiting)    UTI (urinary tract infection)    hx    Family History  Problem Relation Age of Onset   Hypertension Father    Hyperlipidemia Father    Heart failure Father     Heart disease Maternal Grandmother    Hyperlipidemia Maternal Grandmother    Hypertension Maternal Grandmother    Diabetes Maternal Grandmother     Past Surgical History:  Procedure Laterality Date   ANTERIOR CERVICAL DECOMP/DISCECTOMY FUSION N/A 04/29/2012   Procedure: C5-6, C6-7 Anterior Cervical Discectomy and Fusion, Allograft, Plate;  Surgeon: Eldred Manges, MD;  Location: [facility];  Service: Orthopedics;  Laterality: N/A;  C5-6, C6-7 Anterior Cervical Discectomy and Fusion, Allograft and Plate   CHOLECYSTECTOMY     LEFT HEART CATH AND CORONARY ANGIOGRAPHY N/A 04/09/2018   Procedure: LEFT HEART CATH AND CORONARY ANGIOGRAPHY;  Surgeon: Yvonne Kendall, MD;  Location: [facility];  Service: Cardiovascular;  Laterality: N/A;   Social History   Occupational History   Not on file  Tobacco Use   Smoking status: Never   Smokeless tobacco: Never  Vaping Use   Vaping Use: Never used  Substance and Sexual Activity   Alcohol use: No   Drug use: No   Sexual activity: Not on file

## 2022-07-05 ENCOUNTER — Ambulatory Visit
Admission: RE | Admit: 2022-07-05 | Discharge: 2022-07-05 | Disposition: A | Discharge status: Home / Self Care | Payer: BLUE CROSS/BLUE SHIELD | Source: Ambulatory Visit | Attending: Orthopaedic Surgery | Admitting: Orthopaedic Surgery

## 2022-07-05 DIAGNOSIS — M5416 Radiculopathy, lumbar region: Secondary | ICD-10-CM | POA: Diagnosis not present

## 2022-07-05 DIAGNOSIS — M48061 Spinal stenosis, lumbar region without neurogenic claudication: Secondary | ICD-10-CM | POA: Diagnosis not present

## 2022-07-05 DIAGNOSIS — M5126 Other intervertebral disc displacement, lumbar region: Secondary | ICD-10-CM | POA: Diagnosis not present

## 2022-07-12 ENCOUNTER — Encounter: Payer: Self-pay | Admitting: Orthopaedic Surgery

## 2022-07-17 ENCOUNTER — Telehealth: Payer: Self-pay | Admitting: Orthopaedic Surgery

## 2022-07-17 NOTE — Telephone Encounter (Signed)
Appointment scheduled. I left voicemail for patient advising.

## 2022-07-17 NOTE — Telephone Encounter (Signed)
Patient waiting on conformation of appointment for tomorrow at 1:45 states she was told she could come in per [facility].. please advise

## 2022-07-18 ENCOUNTER — Encounter: Payer: Self-pay | Admitting: Orthopaedic Surgery

## 2022-07-18 ENCOUNTER — Ambulatory Visit: Payer: BLUE CROSS/BLUE SHIELD | Admitting: Orthopaedic Surgery

## 2022-07-18 VITALS — BP 135/86 | HR 68 | Ht 67.0 in | Wt 174.0 lb

## 2022-07-18 DIAGNOSIS — M5416 Radiculopathy, lumbar region: Secondary | ICD-10-CM | POA: Diagnosis not present

## 2022-07-18 NOTE — Progress Notes (Unsigned)
   Office Visit Note   Patient: Caroline Garcia           Date of Birth: 1960/07/25           MRN: [id number] Visit Date: 07/18/2022              Requested by: No referring provider defined for this encounter. PCP: Patient, No Pcp Per   Assessment & Plan: Visit Diagnoses: No diagnosis found.  Plan: ***  Follow-Up Instructions: No follow-ups on file.   Orders:  No orders of the defined types were placed in this encounter.  No orders of the defined types were placed in this encounter.     Procedures: No procedures performed   Clinical Data: No additional findings.   Subjective: Chief Complaint  Patient presents with   Lower Back - Pain, Follow-up    MRI review    HPI  Review of Systems   Objective: Vital Signs: BP 135/86   Pulse 68   Ht 5\' 7"  (1.702 m)   Wt 174 lb (78.9 kg)   BMI 27.25 kg/m   Physical Exam  Ortho Exam  Specialty Comments:  No specialty comments available.  Imaging: No results found.   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  TTE 03/2018: EF of 60 to 65%, normal LV cavity size, normal LV diastolic function parameters, no regional wall motion abnormalities, normal RV systolic function with normal RV cavity size and normal RV wall thickness.  There were no significant valvular abnormalities; b. 04/2018 Echo: EF 60-65%, nl RV fxn. No pericardial effusion.   Myocarditis ([facility])    a.  LHC 03/2018: No significant CAD by angiography, normal LVEF, normal LV filling pressure   PONV (postoperative nausea and vomiting)    UTI (urinary tract infection)    hx    Family History  Problem Relation Age of Onset   Hypertension Father    Hyperlipidemia Father    Heart  failure Father    Heart disease Maternal Grandmother    Hyperlipidemia Maternal Grandmother    Hypertension Maternal Grandmother    Diabetes Maternal Grandmother     Past Surgical History:  Procedure Laterality Date   ANTERIOR CERVICAL DECOMP/DISCECTOMY FUSION N/A 04/29/2012   Procedure: C5-6, C6-7 Anterior Cervical Discectomy and Fusion, Allograft, Plate;  Surgeon: Eldred Manges, MD;  Location: [facility];  Service: Orthopedics;  Laterality: N/A;  C5-6, C6-7 Anterior Cervical Discectomy and Fusion, Allograft and Plate   CHOLECYSTECTOMY     LEFT HEART CATH AND CORONARY ANGIOGRAPHY N/A 04/09/2018   Procedure: LEFT HEART CATH AND CORONARY ANGIOGRAPHY;  Surgeon: Yvonne Kendall, MD;  Location: [facility];  Service: Cardiovascular;  Laterality: N/A;   Social History   Occupational History   Not on file  Tobacco Use   Smoking status: Never   Smokeless tobacco: Never  Vaping Use   Vaping Use: Never used  Substance and Sexual Activity   Alcohol use: No   Drug use: No   Sexual activity: Not on file

## 2022-07-26 ENCOUNTER — Other Ambulatory Visit: Payer: Self-pay | Admitting: Physical Medicine and Rehabilitation

## 2022-07-26 ENCOUNTER — Telehealth: Payer: Self-pay

## 2022-07-26 MED ORDER — DIAZEPAM 5 MG PO TABS: ORAL_TABLET | ORAL | 0 refills | Status: AC

## 2022-07-26 NOTE — Telephone Encounter (Signed)
Patient is scheduled for ESI on 08/07/22. Needs pre-procedure Valium sent to [facility] in [place], [place]

## 2022-08-01 ENCOUNTER — Ambulatory Visit: Payer: BLUE CROSS/BLUE SHIELD | Admitting: Orthopaedic Surgery

## 2022-08-07 ENCOUNTER — Ambulatory Visit: Payer: BLUE CROSS/BLUE SHIELD | Admitting: Physical Medicine and Rehabilitation

## 2022-08-07 ENCOUNTER — Other Ambulatory Visit: Payer: Self-pay

## 2022-08-07 VITALS — BP 146/86 | HR 77

## 2022-08-07 DIAGNOSIS — M5416 Radiculopathy, lumbar region: Secondary | ICD-10-CM | POA: Diagnosis not present

## 2022-08-07 MED ORDER — METHYLPREDNISOLONE ACETATE 80 MG/ML IJ SUSP
80.0000 mg | Freq: Once | INTRAMUSCULAR | Status: AC
  Administered 2022-08-07: 80 mg

## 2022-08-07 NOTE — Progress Notes (Signed)
Functional Pain Scale - descriptive words and definitions  Distracting (5)    Aware of pain/able to complete some ADL's but limited by pain/sleep is affected and active distractions are only slightly useful. Moderate range order  Average Pain 7   +Driver, -BT, -Dye Allergies.  Lower back pain on left side that radiates into left leg to the calf

## 2022-08-08 ENCOUNTER — Ambulatory Visit: Payer: BLUE CROSS/BLUE SHIELD | Admitting: Orthopaedic Surgery

## 2022-08-15 ENCOUNTER — Ambulatory Visit: Payer: BLUE CROSS/BLUE SHIELD | Admitting: Orthopaedic Surgery

## 2022-08-21 NOTE — Progress Notes (Signed)
Caroline Garcia - 62 y.o. female MRN [id number]  Date of birth: 1960-12-01  Office Visit Note: Visit Date: 08/07/2022 PCP: Patient, No Pcp Per Referred by: Eldred Manges, MD  Subjective: Chief Complaint  Patient presents with   Lower Back - Pain   HPI:  Caroline Garcia is a 62 y.o. female who comes in today at the request of Dr. Annell Greening for planned Left L5-S1 Lumbar Interlaminar epidural steroid injection with fluoroscopic guidance.  The patient has failed conservative care including home exercise, medications, time and activity modification.  This injection will be diagnostic and hopefully therapeutic.  Please see requesting physician notes for further details and justification.   ROS Otherwise per HPI.  Assessment & Plan: Visit Diagnoses:    ICD-10-CM   1. Lumbar radiculopathy  M54.16 XR C-ARM NO REPORT    Epidural Steroid injection    methylPREDNISolone acetate (DEPO-MEDROL) injection 80 mg      Plan: No additional findings.   Meds & Orders:  Meds ordered this encounter  Medications   methylPREDNISolone acetate (DEPO-MEDROL) injection 80 mg    Orders Placed This Encounter  Procedures   XR C-ARM NO REPORT   Epidural Steroid injection    Follow-up: Return for visit to requesting provider as needed.   Procedures: No procedures performed  Lumbar Epidural Steroid Injection - Interlaminar Approach with Fluoroscopic Guidance  Patient: Caroline Garcia      Date of Birth: 1960/10/09 MRN: [id number] PCP: Patient, No Pcp Per      Visit Date: 08/07/2022   Universal Protocol:     Consent Given By: the patient  Position: PRONE  Additional Comments: Vital signs were monitored before and after the procedure. Patient was prepped and draped in the usual sterile fashion. The correct patient, procedure, and site was verified.   Injection Procedure Details:   Procedure diagnoses: Lumbar radiculopathy [M54.16]   Meds Administered:  Meds ordered this encounter   Medications   methylPREDNISolone acetate (DEPO-MEDROL) injection 80 mg     Laterality: Left  Location/Site:  L5-S1  Needle: 3.5 in., 20 ga. Tuohy  Needle Placement: Paramedian epidural  Findings:   -Comments: Excellent flow of contrast into the epidural space.  Procedure Details: Using a paramedian approach from the side mentioned above, the region overlying the inferior lamina was localized under fluoroscopic visualization and the soft tissues overlying this structure were infiltrated with 4 ml. of 1% Lidocaine without Epinephrine. The Tuohy needle was inserted into the epidural space using a paramedian approach.   The epidural space was localized using loss of resistance along with counter oblique bi-planar fluoroscopic views.  After negative aspirate for air, blood, and CSF, a 2 ml. volume of Isovue-250 was injected into the epidural space and the flow of contrast was observed. Radiographs were obtained for documentation purposes.    The injectate was administered into the level noted above.   Additional Comments:  No complications occurred Dressing: 2 x 2 sterile gauze and Band-Aid    Post-procedure details: Patient was observed during the procedure. Post-procedure instructions were reviewed.  Patient left the clinic in stable condition.   Clinical History: MRI LUMBAR SPINE WITHOUT CONTRAST   TECHNIQUE: Multiplanar, multisequence MR imaging of the lumbar spine was performed. No intravenous contrast was administered.   COMPARISON:  Lumbar spine radiograph 12/28/2021   FINDINGS: Segmentation:  Standard.   Alignment:  Trace degenerative anterolisthesis at L4-L5.   Vertebrae: No fracture, evidence of discitis, or aggressive bone lesion.   Conus  medullaris and cauda equina: Conus extends to the L1 level. Conus and cauda equina appear normal.   Paraspinal and other soft tissues: Negative   Disc levels:   T11-T12: Minimal disc bulging but in right-sided facet  arthropathy. Mild right-sided neural foraminal narrowing.   T12-L1: No significant spinal canal or neural foraminal narrowing.   L1-L2: Minimal disc bulging. No significant spinal canal or neural foraminal stenosis.   L2-L3: Mild disc bulging, lung flavum hypertrophy involves arthropathy. There is mild spinal canal stenosis with left greater than right subarticular narrowing and mild bilateral neural foraminal stenosis.   L3-L4: Mild disc bulging, ligamentum hypertrophy and mild facet arthropathy results in moderate spinal canal stenosis, mild right and no significant left neural foraminal stenosis.   L4-L5: Trace degenerative listhesis with broad disc bulging, ligamentum hypertrophy and bowel facet arthropathy results in severe bilateral subarticular stenosis, mild to moderate right and mild left neural foraminal stenosis.   L5-S1: Mild disc bulging and facet arthropathy results in mild left neural foraminal narrowing. No significant spinal canal stenosis.   IMPRESSION: Multilevel degenerative changes of the lumbar spine, worst at L3-L4 and L4-L5.   L2-L3: Mild spinal canal stenosis with left greater than right subarticular narrowing and mild bilateral neural foraminal stenosis.   L3-L4: Moderate spinal canal stenosis. Mild right neural foraminal stenosis.   L4-L5: Severe bilateral subarticular stenosis. Mild-to-moderate right and mild left neural foraminal stenosis.   L5-S1: Mild left neural foraminal stenosis.     Electronically Signed   By: Caprice Renshaw M.D.   On: 07/10/2022 15:23     Objective:  VS:  HT:    WT:   BMI:     BP:(!) 146/86  HR:77bpm  TEMP: ( )  RESP:  Physical Exam Vitals and nursing note reviewed.  Constitutional:      General: She is not in acute distress.    Appearance: Normal appearance. She is not ill-appearing.  HENT:     Head: Normocephalic and atraumatic.     Right Ear: External ear normal.     Left Ear: External ear normal.   Eyes:     Extraocular Movements: Extraocular movements intact.  Cardiovascular:     Rate and Rhythm: Normal rate.     Pulses: Normal pulses.  Pulmonary:     Effort: Pulmonary effort is normal. No respiratory distress.  Abdominal:     General: There is no distension.     Palpations: Abdomen is soft.  Musculoskeletal:        General: Tenderness present.     Cervical back: Neck supple.     Right lower leg: No edema.     Left lower leg: No edema.     Comments: Patient has good distal strength with no pain over the greater trochanters.  No clonus or focal weakness.  Skin:    Findings: No erythema, lesion or rash.  Neurological:     General: No focal deficit present.     Mental Status: She is alert and oriented to person, place, and time.     Sensory: No sensory deficit.     Motor: No weakness or abnormal muscle tone.     Coordination: Coordination normal.  Psychiatric:        Mood and Affect: Mood normal.        Behavior: Behavior normal.      Imaging: No results found.

## 2022-08-21 NOTE — Procedures (Signed)
Lumbar Epidural Steroid Injection - Interlaminar Approach with Fluoroscopic Guidance  Patient: Caroline Garcia      Date of Birth: 08/03/1960 MRN: [id number] PCP: Patient, No Pcp Per      Visit Date: 08/07/2022   Universal Protocol:     Consent Given By: the patient  Position: PRONE  Additional Comments: Vital signs were monitored before and after the procedure. Patient was prepped and draped in the usual sterile fashion. The correct patient, procedure, and site was verified.   Injection Procedure Details:   Procedure diagnoses: Lumbar radiculopathy [M54.16]   Meds Administered:  Meds ordered this encounter  Medications   methylPREDNISolone acetate (DEPO-MEDROL) injection 80 mg     Laterality: Left  Location/Site:  L5-S1  Needle: 3.5 in., 20 ga. Tuohy  Needle Placement: Paramedian epidural  Findings:   -Comments: Excellent flow of contrast into the epidural space.  Procedure Details: Using a paramedian approach from the side mentioned above, the region overlying the inferior lamina was localized under fluoroscopic visualization and the soft tissues overlying this structure were infiltrated with 4 ml. of 1% Lidocaine without Epinephrine. The Tuohy needle was inserted into the epidural space using a paramedian approach.   The epidural space was localized using loss of resistance along with counter oblique bi-planar fluoroscopic views.  After negative aspirate for air, blood, and CSF, a 2 ml. volume of Isovue-250 was injected into the epidural space and the flow of contrast was observed. Radiographs were obtained for documentation purposes.    The injectate was administered into the level noted above.   Additional Comments:  No complications occurred Dressing: 2 x 2 sterile gauze and Band-Aid    Post-procedure details: Patient was observed during the procedure. Post-procedure instructions were reviewed.  Patient left the clinic in stable condition.

## 2023-01-19 ENCOUNTER — Ambulatory Visit: Payer: BLUE CROSS/BLUE SHIELD | Admitting: Orthopaedic Surgery

## 2023-01-19 ENCOUNTER — Encounter: Payer: Self-pay | Admitting: Orthopaedic Surgery

## 2023-01-19 VITALS — BP 117/74 | HR 78 | Ht 67.5 in | Wt 190.0 lb

## 2023-01-19 DIAGNOSIS — M5416 Radiculopathy, lumbar region: Secondary | ICD-10-CM | POA: Diagnosis not present

## 2023-01-22 NOTE — Progress Notes (Signed)
Office Visit Note   Patient: Caroline Garcia           Date of Birth: 1960/03/23           MRN: [id number] Visit Date: 01/19/2023              Requested by: No referring provider defined for this encounter. PCP: Patient, No Pcp Per   Assessment & Plan: Visit Diagnoses:  1. Lumbar radiculopathy     Plan: We reviewed her MRI scan she has degenerative changes from L2-S1.  Multilevel disc bulges but none with severe compression.  Reviewed images plain radiographs discussed pathophysiology.  At this point I would recommend she see Dr. Alvester Morin again consider second epidural he can decide about appropriate location and position.  She has multilevel degenerative changes and may have 2 or possibly 3 levels contributing somewhat to her symptoms.  She can follow-up with me after the epidural.  Follow-Up Instructions: No follow-ups on file.   Orders:  Orders Placed This Encounter  Procedures   Ambulatory referral to Physical Medicine Rehab   No orders of the defined types were placed in this encounter.     Procedures: No procedures performed   Clinical Data: No additional findings.   Subjective: Chief Complaint  Patient presents with   Lower Back - Pain, Follow-up    HPI 62 year old female returns she had epidural injection 08/07/2022.  States the injection lasted for 6 days then she did have recurrence of symptoms.  States pains in her left calf some numbness stop to her toes ibuprofen is giving her some relief she does better if she is sitting or laying down worse if she is in upright or standing position.  No claudication symptoms last MRI scan 07/10/2022 was reviewed today.  Her epidural was 08/08/2022.  Review of Systems no chills or fever no bowel bladder associated symptoms.  All other systems noncontributory to HPI.   Objective: Vital Signs: BP 117/74   Pulse 78   Ht 5' 7.5" (1.715 m)   Wt 190 lb (86.2 kg)   BMI 29.32 kg/m   Physical Exam Constitutional:       Appearance: She is well-developed.  HENT:     Head: Normocephalic.     Right Ear: External ear normal.     Left Ear: External ear normal. There is no impacted cerumen.  Eyes:     Pupils: Pupils are equal, round, and reactive to light.  Neck:     Thyroid: No thyromegaly.     Trachea: No tracheal deviation.  Cardiovascular:     Rate and Rhythm: Normal rate.  Pulmonary:     Effort: Pulmonary effort is normal.  Abdominal:     Palpations: Abdomen is soft.  Musculoskeletal:     Cervical back: No rigidity.  Skin:    General: Skin is warm and dry.  Neurological:     Mental Status: She is alert and oriented to person, place, and time.  Psychiatric:        Behavior: Behavior normal.     Ortho Exam patient has some sciatic notch tenderness on the left negative on the right negative logroll hips minimal tenderness to the trochanteric bursa on the left.  Some discomfort with sciatic stretch and popliteal compression test.  Knee and ankle jerk are intact.  Specialty Comments:  MRI LUMBAR SPINE WITHOUT CONTRAST   TECHNIQUE: Multiplanar, multisequence MR imaging of the lumbar spine was performed. No intravenous contrast was administered.   COMPARISON:  Lumbar spine radiograph 12/28/2021   FINDINGS: Segmentation:  Standard.   Alignment:  Trace degenerative anterolisthesis at L4-L5.   Vertebrae: No fracture, evidence of discitis, or aggressive bone lesion.   Conus medullaris and cauda equina: Conus extends to the L1 level. Conus and cauda equina appear normal.   Paraspinal and other soft tissues: Negative   Disc levels:   T11-T12: Minimal disc bulging but in right-sided facet arthropathy. Mild right-sided neural foraminal narrowing.   T12-L1: No significant spinal canal or neural foraminal narrowing.   L1-L2: Minimal disc bulging. No significant spinal canal or neural foraminal stenosis.   L2-L3: Mild disc bulging, lung flavum hypertrophy involves arthropathy. There is  mild spinal canal stenosis with left greater than right subarticular narrowing and mild bilateral neural foraminal stenosis.   L3-L4: Mild disc bulging, ligamentum hypertrophy and mild facet arthropathy results in moderate spinal canal stenosis, mild right and no significant left neural foraminal stenosis.   L4-L5: Trace degenerative listhesis with broad disc bulging, ligamentum hypertrophy and bowel facet arthropathy results in severe bilateral subarticular stenosis, mild to moderate right and mild left neural foraminal stenosis.   L5-S1: Mild disc bulging and facet arthropathy results in mild left neural foraminal narrowing. No significant spinal canal stenosis.   IMPRESSION: Multilevel degenerative changes of the lumbar spine, worst at L3-L4 and L4-L5.   L2-L3: Mild spinal canal stenosis with left greater than right subarticular narrowing and mild bilateral neural foraminal stenosis.   L3-L4: Moderate spinal canal stenosis. Mild right neural foraminal stenosis.   L4-L5: Severe bilateral subarticular stenosis. Mild-to-moderate right and mild left neural foraminal stenosis.   L5-S1: Mild left neural foraminal stenosis.     Electronically Signed   By: Caprice Renshaw M.D.   On: 07/10/2022 15:23  Imaging: No results found.   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  TTE 03/2018: EF of 60 to 65%, normal LV cavity size, normal LV diastolic function parameters, no regional wall motion abnormalities, normal RV systolic function with normal RV cavity size and normal RV wall thickness.  There were no significant valvular abnormalities; b. 04/2018 Echo: EF 60-65%, nl RV fxn. No pericardial effusion.   Myocarditis  (HCC)    a.  LHC 03/2018: No significant CAD by angiography, normal LVEF, normal LV filling pressure   PONV (postoperative nausea and vomiting)    UTI (urinary tract infection)    hx    Family History  Problem Relation Age of Onset   Hypertension Father    Hyperlipidemia Father    Heart failure Father    Heart disease Maternal Grandmother    Hyperlipidemia Maternal Grandmother    Hypertension Maternal Grandmother    Diabetes Maternal Grandmother     Past Surgical History:  Procedure Laterality Date   ANTERIOR CERVICAL DECOMP/DISCECTOMY FUSION N/A 04/29/2012   Procedure: C5-6, C6-7 Anterior Cervical Discectomy and Fusion, Allograft, Plate;  Surgeon: Eldred Manges, MD;  Location: [facility] OR;  Service: Orthopedics;  Laterality: N/A;  C5-6, C6-7 Anterior Cervical Discectomy and Fusion, Allograft and Plate   CHOLECYSTECTOMY     LEFT HEART CATH AND CORONARY ANGIOGRAPHY N/A 04/09/2018   Procedure: LEFT HEART CATH AND CORONARY ANGIOGRAPHY;  Surgeon: Yvonne Kendall, MD;  Location: [facility] INVASIVE CV LAB;  Service: Cardiovascular;  Laterality: N/A;   Social History   Occupational History   Not on file  Tobacco Use   Smoking status: Never   Smokeless tobacco: Never  Vaping Use   Vaping status: Never Used  Substance and Sexual Activity   Alcohol use: No   Drug use: No   Sexual activity: Not on file

## 2023-01-31 ENCOUNTER — Encounter: Payer: Self-pay | Admitting: Internal Medicine

## 2023-01-31 ENCOUNTER — Ambulatory Visit: Payer: BLUE CROSS/BLUE SHIELD | Attending: Internal Medicine | Admitting: Internal Medicine

## 2023-01-31 VITALS — BP 121/84 | HR 72 | Ht 67.0 in | Wt 199.0 lb

## 2023-01-31 DIAGNOSIS — I319 Disease of pericardium, unspecified: Secondary | ICD-10-CM

## 2023-01-31 DIAGNOSIS — R079 Chest pain, unspecified: Secondary | ICD-10-CM | POA: Diagnosis not present

## 2023-01-31 NOTE — Patient Instructions (Signed)
Medication Instructions:  Your physician recommends that you continue on your current medications as directed. Please refer to the Current Medication list given to you today.   *If you need a refill on your cardiac medications before your next appointment, please call your pharmacy*   Lab Work: No labs ordered today    Testing/Procedures: Your physician has requested that you have an echocardiogram. Echocardiography is a painless test that uses sound waves to create images of your heart. It provides your doctor with information about the size and shape of your heart and how well your heart's chambers and valves are working.   You may receive an ultrasound enhancing agent through an IV if needed to better visualize your heart during the echo. This procedure takes approximately one hour.  There are no restrictions for this procedure.  This will take place at [address] [facility]) [address], [place] [address]  Please note: We ask at that you not bring children with you during ultrasound (echo/ vascular) testing. Due to room size and safety concerns, children are not allowed in the ultrasound rooms during exams. Our front office staff cannot provide observation of children in our lobby area while testing is being conducted. An adult accompanying a patient to their appointment will only be allowed in the ultrasound room at the discretion of the ultrasound technician under special circumstances. We apologize for any inconvenience.    Follow-Up: At [facility], you and your health needs are our priority.  As part of our continuing mission to provide you with exceptional heart care, we have created designated Provider Care Teams.  These Care Teams include your primary Cardiologist (physician) and Advanced Practice Providers (APPs -  Physician Assistants and Nurse Practitioners) who all work together to provide you with the care you need, when you need it.  We recommend  signing up for the patient portal called "MyChart".  Sign up information is provided on this After Visit Summary.  MyChart is used to connect with patients for Virtual Visits (Telemedicine).  Patients are able to view lab/test results, encounter notes, upcoming appointments, etc.  Non-urgent messages can be sent to your provider as well.   To learn more about what you can do with MyChart, go to [email or web address].    Your next appointment:   1 month(s)  Provider:   You may see Yvonne Kendall, MD or one of the following Advanced Practice Providers on your designated Care Team:   Nicolasa Ducking, NP Eula Listen, PA-C Cadence Fransico Michael, PA-C Charlsie Quest, NP Carlos Levering, NP

## 2023-01-31 NOTE — Progress Notes (Signed)
  Cardiology Office Note:  .   Date:  01/31/2023  ID:  Caroline Garcia, DOB 04-29-1960, MRN [id number] PCP: Patient, No Pcp Per  [facility] Providers Cardiologist:  Yvonne Kendall, MD     History of Present Illness: .   Caroline Garcia is a 62 y.o. female with history of suspected myopericarditis (03/2018), who presents for follow-up of myopericarditis.  I last saw her in 02/2022, at which time she reported feeling well other than a transient "weird" sensation that occurred intermittently over the course of a month in the setting of recent COVID-19 infection.  It was not reminiscent of what she felt with her myopericarditis.  No further testing or intervention was pursued.  Today, Caroline Garcia reports that she had been feeling well up until this past weekend when she suddenly developed a dull ache in the right side of her chest that was reminiscent of what she experienced when she had myopericarditis in 2020.  The pain waxed and waned in intensity but has subsequently diminished and essentially resolved.  She felt like it was difficult to take in a deep breath.  There were no associated symptoms such as palpitations, lightheadedness, and edema.  She denies has not traveled or started any new medications.  ROS: See HPI  Studies Reviewed: Marland Kitchen   EKG Interpretation Date/Time:  Wednesday January 31 2023 15:59:35 EST Ventricular Rate:  72 PR Interval:  156 QRS Duration:  66 QT Interval:  380 QTC Calculation: 416 R Axis:   27  Text Interpretation: Normal sinus rhythm with sinus arrhythmia Normal ECG When compared with ECG of 16-Mar-2022 No significant change was found Confirmed by Keshonda Monsour, Cristal Deer [phone]) on 01/31/2023 4:21:31 PM    Risk Assessment/Calculations:             Physical Exam:   VS:  BP 121/84 (BP Location: Left Arm, Patient Position: Sitting, Cuff Size: Normal)   Pulse 72   Ht 5\' 7"  (1.702 m)   Wt 199 lb (90.3 kg)   SpO2 97%   BMI 31.17 kg/m    Wt Readings from  Last 3 Encounters:  01/31/23 199 lb (90.3 kg)  01/19/23 190 lb (86.2 kg)  07/18/22 174 lb (78.9 kg)    General:  NAD. Neck: No JVD or HJR. Lungs: Clear to auscultation bilaterally without wheezes or crackles. Heart: Regular rate and rhythm without murmurs, rubs, or gallops. Abdomen: Soft, nontender, nondistended. Extremities: No lower extremity edema.  ASSESSMENT AND PLAN: .    Chest pain and history of myopericarditis: Ms. Discher reports onset of atypical chest pain and difficulty taking a deep breath 5 days ago, which has waxed and waned in intensity but has almost completely resolved at this point.  Her physical exam today is unremarkable.  Her EKG is also normal and unchanged compared to a year ago.  We have agreed to obtain an expedited echocardiogram.  We will defer medication changes at this time.  I asked her to contact us or seek immediate medical attention if she has any recrudescence in her chest pain.    Dispo: Return to clinic in 1 month.  Signed, Yvonne Kendall, MD

## 2023-02-02 ENCOUNTER — Ambulatory Visit: Payer: BLUE CROSS/BLUE SHIELD | Attending: Internal Medicine

## 2023-02-02 DIAGNOSIS — R079 Chest pain, unspecified: Secondary | ICD-10-CM

## 2023-02-02 DIAGNOSIS — I319 Disease of pericardium, unspecified: Secondary | ICD-10-CM

## 2023-02-05 ENCOUNTER — Ambulatory Visit (HOSPITAL_COMMUNITY): Payer: BLUE CROSS/BLUE SHIELD

## 2023-02-06 LAB — ECHOCARDIOGRAM COMPLETE
AR max vel: 2.18 cm2
AV Area VTI: 2.21 cm2
AV Area mean vel: 2.11 cm2
AV Mean grad: 4 mm[Hg]
AV Peak grad: 6.4 mm[Hg]
Ao pk vel: 1.26 m/s
Area-P 1/2: 3.22 cm2
Calc EF: 59.3 %
S' Lateral: 3 cm
Single Plane A2C EF: 59.1 %
Single Plane A4C EF: 59.1 %

## 2023-02-12 ENCOUNTER — Other Ambulatory Visit: Payer: Self-pay

## 2023-02-12 ENCOUNTER — Ambulatory Visit: Payer: BLUE CROSS/BLUE SHIELD | Admitting: Physical Medicine and Rehabilitation

## 2023-02-12 DIAGNOSIS — M5416 Radiculopathy, lumbar region: Secondary | ICD-10-CM | POA: Diagnosis not present

## 2023-02-12 MED ORDER — METHYLPREDNISOLONE ACETATE 40 MG/ML IJ SUSP
40.0000 mg | Freq: Once | INTRAMUSCULAR | Status: AC
  Administered 2023-02-12: 40 mg

## 2023-02-12 NOTE — Addendum Note (Signed)
Addended by: Ashok Norris on: 02/12/2023 02:17 PM   Modules accepted: Orders

## 2023-02-12 NOTE — Patient Instructions (Signed)

## 2023-02-12 NOTE — Progress Notes (Signed)
Functional Pain Scale - descriptive words and definitions  Moderate (4)   Constantly aware of pain, can complete ADLs with modification/sleep marginally affected at times/passive distraction is of no use, but active distraction gives some relief. Moderate range order  Average Pain 4 157/88 She does have numbness when she is having a bad back day  Worse on L than right   +Driver, -BT, -Dye Allergies.

## 2023-02-12 NOTE — Progress Notes (Signed)
Caroline Garcia - 62 y.o. female MRN [id number]  Date of birth: [birth year]  Office Visit Note: Visit Date: 02/12/2023 PCP: Patient, No Pcp Per Referred by: Eldred Manges, MD  Subjective: Chief Complaint  Patient presents with   Lower Back - Pain   HPI:  Caroline Garcia is a 62 y.o. female who comes in today at the request of Dr. Annell Greening for planned Left L4-5 Lumbar Interlaminar epidural steroid injection with fluoroscopic guidance.  The patient has failed conservative care including home exercise, medications, time and activity modification.  This injection will be diagnostic and hopefully therapeutic.  Please see requesting physician notes for further details and justification. Prior L5-S1 interlaminar injection gave about 2 weeks of very good relief then pain returned. She has moderate central stenosis at L3-4 and L4-5 and several lateral recess.    ROS Otherwise per HPI.  Assessment & Plan: Visit Diagnoses:    ICD-10-CM   1. Lumbar radiculopathy  M54.16 XR C-ARM NO REPORT    Epidural Steroid injection    methylPREDNISolone acetate (DEPO-MEDROL) injection 40 mg      Plan: No additional findings.   Meds & Orders:  Meds ordered this encounter  Medications   methylPREDNISolone acetate (DEPO-MEDROL) injection 40 mg    Orders Placed This Encounter  Procedures   XR C-ARM NO REPORT   Epidural Steroid injection    Follow-up: Return for visit to requesting provider as needed.   Procedures: No procedures performed  Lumbar Epidural Steroid Injection - Interlaminar Approach with Fluoroscopic Guidance  Patient: Caroline Garcia      Date of Birth: [birth year] MRN: [id number] PCP: Patient, No Pcp Per      Visit Date: 02/12/2023   Universal Protocol:     Consent Given By: the patient  Position: PRONE  Additional Comments: Vital signs were monitored before and after the procedure. Patient was prepped and draped in the usual sterile fashion. The correct patient,  procedure, and site was verified.   Injection Procedure Details:   Procedure diagnoses: Lumbar radiculopathy [M54.16]   Meds Administered:  Meds ordered this encounter  Medications   methylPREDNISolone acetate (DEPO-MEDROL) injection 40 mg     Laterality: Left  Location/Site:  L4-5  Needle: 3.5 in., 20 ga. Tuohy  Needle Placement: Paramedian epidural  Findings:   -Comments: Excellent flow of contrast into the epidural space.  Procedure Details: Using a paramedian approach from the side mentioned above, the region overlying the inferior lamina was localized under fluoroscopic visualization and the soft tissues overlying this structure were infiltrated with 4 ml. of 1% Lidocaine without Epinephrine. The Tuohy needle was inserted into the epidural space using a paramedian approach.   The epidural space was localized using loss of resistance along with counter oblique bi-planar fluoroscopic views.  After negative aspirate for air, blood, and CSF, a 2 ml. volume of Isovue-250 was injected into the epidural space and the flow of contrast was observed. Radiographs were obtained for documentation purposes.    The injectate was administered into the level noted above.   Additional Comments:  No complications occurred Dressing: 2 x 2 sterile gauze and Band-Aid    Post-procedure details: Patient was observed during the procedure. Post-procedure instructions were reviewed.  Patient left the clinic in stable condition.   Clinical History: MRI LUMBAR SPINE WITHOUT CONTRAST   TECHNIQUE: Multiplanar, multisequence MR imaging of the lumbar spine was performed. No intravenous contrast was administered.   COMPARISON:  Lumbar spine radiograph 12/28/2021  FINDINGS: Segmentation:  Standard.   Alignment:  Trace degenerative anterolisthesis at L4-L5.   Vertebrae: No fracture, evidence of discitis, or aggressive bone lesion.   Conus medullaris and cauda equina: Conus extends to  the L1 level. Conus and cauda equina appear normal.   Paraspinal and other soft tissues: Negative   Disc levels:   T11-T12: Minimal disc bulging but in right-sided facet arthropathy. Mild right-sided neural foraminal narrowing.   T12-L1: No significant spinal canal or neural foraminal narrowing.   L1-L2: Minimal disc bulging. No significant spinal canal or neural foraminal stenosis.   L2-L3: Mild disc bulging, lung flavum hypertrophy involves arthropathy. There is mild spinal canal stenosis with left greater than right subarticular narrowing and mild bilateral neural foraminal stenosis.   L3-L4: Mild disc bulging, ligamentum hypertrophy and mild facet arthropathy results in moderate spinal canal stenosis, mild right and no significant left neural foraminal stenosis.   L4-L5: Trace degenerative listhesis with broad disc bulging, ligamentum hypertrophy and bowel facet arthropathy results in severe bilateral subarticular stenosis, mild to moderate right and mild left neural foraminal stenosis.   L5-S1: Mild disc bulging and facet arthropathy results in mild left neural foraminal narrowing. No significant spinal canal stenosis.   IMPRESSION: Multilevel degenerative changes of the lumbar spine, worst at L3-L4 and L4-L5.   L2-L3: Mild spinal canal stenosis with left greater than right subarticular narrowing and mild bilateral neural foraminal stenosis.   L3-L4: Moderate spinal canal stenosis. Mild right neural foraminal stenosis.   L4-L5: Severe bilateral subarticular stenosis. Mild-to-moderate right and mild left neural foraminal stenosis.   L5-S1: Mild left neural foraminal stenosis.     Electronically Signed   By: Caprice Renshaw M.D.   On: 07/10/2022 15:23     Objective:  VS:  HT:    WT:   BMI:     BP:   HR: bpm  TEMP: ( )  RESP:  Physical Exam Vitals and nursing note reviewed.  Constitutional:      General: She is not in acute distress.    Appearance:  Normal appearance. She is not ill-appearing.  HENT:     Head: Normocephalic and atraumatic.     Right Ear: External ear normal.     Left Ear: External ear normal.  Eyes:     Extraocular Movements: Extraocular movements intact.  Cardiovascular:     Rate and Rhythm: Normal rate.     Pulses: Normal pulses.  Pulmonary:     Effort: Pulmonary effort is normal. No respiratory distress.  Abdominal:     General: There is no distension.     Palpations: Abdomen is soft.  Musculoskeletal:        General: Tenderness present.     Cervical back: Neck supple.     Right lower leg: No edema.     Left lower leg: No edema.     Comments: Patient has good distal strength with no pain over the greater trochanters.  No clonus or focal weakness.  Skin:    Findings: No erythema, lesion or rash.  Neurological:     General: No focal deficit present.     Mental Status: She is alert and oriented to person, place, and time.     Sensory: No sensory deficit.     Motor: No weakness or abnormal muscle tone.     Coordination: Coordination normal.  Psychiatric:        Mood and Affect: Mood normal.        Behavior: Behavior normal.  Imaging: No results found.

## 2023-02-12 NOTE — Procedures (Signed)
Lumbar Epidural Steroid Injection - Interlaminar Approach with Fluoroscopic Guidance  Patient: Caroline Garcia      Date of Birth: 1960/06/23 MRN: [id number] PCP: Patient, No Pcp Per      Visit Date: 02/12/2023   Universal Protocol:     Consent Given By: the patient  Position: PRONE  Additional Comments: Vital signs were monitored before and after the procedure. Patient was prepped and draped in the usual sterile fashion. The correct patient, procedure, and site was verified.   Injection Procedure Details:   Procedure diagnoses: Lumbar radiculopathy [M54.16]   Meds Administered:  Meds ordered this encounter  Medications   methylPREDNISolone acetate (DEPO-MEDROL) injection 40 mg     Laterality: Left  Location/Site:  L4-5  Needle: 3.5 in., 20 ga. Tuohy  Needle Placement: Paramedian epidural  Findings:   -Comments: Excellent flow of contrast into the epidural space.  Procedure Details: Using a paramedian approach from the side mentioned above, the region overlying the inferior lamina was localized under fluoroscopic visualization and the soft tissues overlying this structure were infiltrated with 4 ml. of 1% Lidocaine without Epinephrine. The Tuohy needle was inserted into the epidural space using a paramedian approach.   The epidural space was localized using loss of resistance along with counter oblique bi-planar fluoroscopic views.  After negative aspirate for air, blood, and CSF, a 2 ml. volume of Isovue-250 was injected into the epidural space and the flow of contrast was observed. Radiographs were obtained for documentation purposes.    The injectate was administered into the level noted above.   Additional Comments:  No complications occurred Dressing: 2 x 2 sterile gauze and Band-Aid    Post-procedure details: Patient was observed during the procedure. Post-procedure instructions were reviewed.  Patient left the clinic in stable condition.

## 2023-02-18 ENCOUNTER — Other Ambulatory Visit: Payer: Self-pay

## 2023-02-18 ENCOUNTER — Emergency Department: Payer: BLUE CROSS/BLUE SHIELD

## 2023-02-18 ENCOUNTER — Encounter: Payer: Self-pay | Admitting: Emergency Medicine

## 2023-02-18 ENCOUNTER — Emergency Department
Admission: EM | Admit: 2023-02-18 | Discharge: 2023-02-18 | Disposition: A | Discharge status: Home / Self Care | Payer: BLUE CROSS/BLUE SHIELD | Attending: Emergency Medicine | Admitting: Emergency Medicine

## 2023-02-18 DIAGNOSIS — K209 Esophagitis, unspecified without bleeding: Secondary | ICD-10-CM | POA: Insufficient documentation

## 2023-02-18 DIAGNOSIS — R0789 Other chest pain: Secondary | ICD-10-CM | POA: Diagnosis present

## 2023-02-18 DIAGNOSIS — R079 Chest pain, unspecified: Secondary | ICD-10-CM

## 2023-02-18 LAB — CBC
HCT: 43.3 % (ref 36.0–46.0)
Hemoglobin: 14.2 g/dL (ref 12.0–15.0)
MCH: 29.5 pg (ref 26.0–34.0)
MCHC: 32.8 g/dL (ref 30.0–36.0)
MCV: 90 fL (ref 80.0–100.0)
Platelets: 381 10*3/uL (ref 150–400)
RBC: 4.81 MIL/uL (ref 3.87–5.11)
RDW: 13.4 % (ref 11.5–15.5)
WBC: 9.1 10*3/uL (ref 4.0–10.5)
nRBC: 0 % (ref 0.0–0.2)

## 2023-02-18 LAB — BASIC METABOLIC PANEL
Anion gap: 10 (ref 5–15)
BUN: 18 mg/dL (ref 8–23)
CO2: 26 mmol/L (ref 22–32)
Calcium: 9.3 mg/dL (ref 8.9–10.3)
Chloride: 100 mmol/L (ref 98–111)
Creatinine, Ser: 0.94 mg/dL (ref 0.44–1.00)
GFR, Estimated: >60 mL/min (ref >60)
Glucose, Bld: 97 mg/dL (ref 70–99)
Potassium: 4.6 mmol/L (ref 3.5–5.1)
Sodium: 136 mmol/L (ref 135–145)

## 2023-02-18 LAB — TROPONIN I (HIGH SENSITIVITY): Troponin I (High Sensitivity): 4 ng/L (ref <18)

## 2023-02-18 MED ORDER — LIDOCAINE VISCOUS HCL 2 % MT SOLN
15.0000 mL | Freq: Once | OROMUCOSAL | Status: AC
  Administered 2023-02-18: 15 mL via ORAL
  Filled 2023-02-18: qty 15

## 2023-02-18 MED ORDER — SUCRALFATE 1 G PO TABS: 1.0000 g | ORAL_TABLET | Freq: Four times a day (QID) | ORAL | 0 refills | Status: AC

## 2023-02-18 MED ORDER — PANTOPRAZOLE SODIUM 40 MG PO TBEC: 40.0000 mg | DELAYED_RELEASE_TABLET | Freq: Every day | ORAL | 1 refills | Status: AC

## 2023-02-18 MED ORDER — ALUM & MAG HYDROXIDE-SIMETH 200-200-20 MG/5ML PO SUSP
30.0000 mL | Freq: Once | ORAL | Status: AC
  Administered 2023-02-18: 30 mL via ORAL
  Filled 2023-02-18: qty 30

## 2023-02-18 NOTE — ED Provider Notes (Signed)
[facility] Provider Note    Event Date/Time   First MD Initiated Contact with Patient 02/18/23 [id number]     (approximate)  History   Chief Complaint: Chest Pain  HPI  Caroline Garcia is a 62 y.o. female with a past medical history of myocarditis, presents to the emergency department for chest pain.  According to the patient for the past 1+ weeks she has been experiencing intermittent but worsening central chest pain.  Patient describes it more as a intermittent burning sensation.  Patient states 4 years ago she had myocarditis and states this feels somewhat similar she was concerned so she came to the emergency department for evaluation.  Denies any trouble breathing.  No nausea or diaphoresis.  Patient does state occasional heartburn/reflux which she believes has been worsening recently.  Physical Exam   Triage Vital Signs: ED Triage Vitals  Encounter Vitals Group     BP 02/18/23 0745 (!) 141/88     Systolic BP Percentile --      Diastolic BP Percentile --      Pulse Rate 02/18/23 0745 68     Resp 02/18/23 0745 18     Temp 02/18/23 0745 98.3 F (36.8 C)     Temp Source 02/18/23 0745 Oral     SpO2 02/18/23 0745 100 %     Weight 02/18/23 0745 197 lb (89.4 kg)     Height 02/18/23 0745 5' 7.5" (1.715 m)     Head Circumference --      Peak Flow --      Pain Score 02/18/23 0744 8     Pain Loc --      Pain Education --      Exclude from Growth Chart --     Most recent vital signs: Vitals:   02/18/23 0745  BP: (!) 141/88  Pulse: 68  Resp: 18  Temp: 98.3 F (36.8 C)  SpO2: 100%    General: Awake, no distress.  CV:  Good peripheral perfusion.  Regular rate and rhythm  Resp:  Normal effort.  Equal breath sounds bilaterally.  Abd:  No distention.  Soft, nontender.  No rebound or guarding.  ED Results / Procedures / Treatments   EKG  EKG viewed and interpreted by myself shows a normal sinus rhythm at 71 bpm with a narrow QRS, normal axis, normal  intervals, no concerning ST changes.  RADIOLOGY  I have reviewed and interpreted the chest x-ray images.  No consolidation on my evaluation. Radiology is read the chest x-ray is negative.   MEDICATIONS ORDERED IN ED: Medications  alum & mag hydroxide-simeth (MAALOX/MYLANTA) 200-200-20 MG/5ML suspension 30 mL (30 mLs Oral Given 02/18/23 0904)    And  lidocaine (XYLOCAINE) 2 % viscous mouth solution 15 mL (15 mLs Oral Given 02/18/23 0904)     IMPRESSION / MDM / ASSESSMENT AND PLAN / ED COURSE  I reviewed the triage vital signs and the nursing notes.  Patient's presentation is most consistent with acute presentation with potential threat to life or bodily function.  Patient presents to the emergency department for 1 week of intermittent chest discomfort/burning sensation.  Patient's major concern is 4 years ago she had myocarditis and wanted to ensure that that was not occurring again.  Patient denies any recent fever cough congestion or illness.  Patient's workup today is reassuring with a normal CBC reassuring chemistry negative troponin normal chest x-ray and reassuring EKG.  Patient states she has been experiencing reflux.  Will  dose a GI cocktail to see if this helps with the patient's symptoms.  Patient states near immediate relief after GI cocktail.  Highly suspect more esophagitis to be the cause of the patient's discomfort.  Given the patient's otherwise reassuring workup we will treat with Protonix and sucralfate have the patient follow-up with her doctor.  Patient agreeable to plan of care.  Provided my typical chest pain return precautions.  FINAL CLINICAL IMPRESSION(S) / ED DIAGNOSES   Esophagitis Chest pain    Note:  This document was prepared using Dragon voice recognition software and may include unintentional dictation errors.   Minna Antis, MD 02/18/23 [phone]

## 2023-02-18 NOTE — ED Triage Notes (Signed)
Patient to ED via POV for centralized CP. Intermittent pain for the past few weeks but worse today. Pain radiates into left arm. Hx of myocarditis.

## 2023-02-18 NOTE — ED Notes (Signed)
See triage note  Presents with some chest discomfort   States she noticed this discomfort about 1-2 weeks ago Denies any fever ,cough or n/v

## 2023-02-22 ENCOUNTER — Other Ambulatory Visit: Payer: BLUE CROSS/BLUE SHIELD

## 2023-03-12 ENCOUNTER — Ambulatory Visit: Payer: BLUE CROSS/BLUE SHIELD | Admitting: Physician Assistant

## 2023-11-19 ENCOUNTER — Ambulatory Visit: Admitting: Orthopaedic Surgery

## 2023-12-31 ENCOUNTER — Encounter: Payer: Self-pay | Admitting: Radiology

## 2024-03-18 ENCOUNTER — Ambulatory Visit: Admitting: Orthopaedic Surgery

## 2024-03-24 ENCOUNTER — Ambulatory Visit: Admitting: Orthopedic Surgery

## 2024-03-28 ENCOUNTER — Ambulatory Visit: Admitting: Orthopedic Surgery

## 2024-04-03 ENCOUNTER — Other Ambulatory Visit: Payer: Self-pay

## 2024-04-03 ENCOUNTER — Ambulatory Visit: Admitting: Orthopaedic Surgery

## 2024-04-03 ENCOUNTER — Encounter: Payer: Self-pay | Admitting: Orthopaedic Surgery

## 2024-04-03 DIAGNOSIS — M5416 Radiculopathy, lumbar region: Secondary | ICD-10-CM

## 2024-04-03 NOTE — Progress Notes (Signed)
 The patient is a very pleasant 64 year old female that I am seeing for the first time but she has been an established patient in the practice especially Dr. Barbarann and Dr. Eldonna.  She last have an epidural and steroid injection at the left L4-L5 level which was an interlaminar injection back in December 2024.  She said that helped greatly and recently she has developed again numbness and tingling down the lateral aspect of her left leg.  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  She said the last injection that Dr. Eldonna had provided for her had done very well and just over the last little while he started to be problematic again to the point that she would like to have another epidural steroid injection.  She denies any change in bowel or bladder function or any weakness.  She says is more irritating than anything.  She has had no acute change in her medical status either.  On exam she does have subjective numbness down the lateral aspect of her left leg but the remainder of her exam shows good strength and good motion of her joints and good muscle tone.  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  We will on getting her scheduled for a left sided L4-L5 epidural steroid injection by Dr. Eldonna hopefully in the near future.  She agrees with this treatment plan.

## 2024-04-08 ENCOUNTER — Ambulatory Visit: Admitting: Sports Medicine

## 2024-04-28 ENCOUNTER — Encounter: Admitting: Physical Medicine and Rehabilitation
